# Patient Record
Sex: Male | Born: 1985
Health system: Southern US, Community
[De-identification: ages and names within clinical notes are randomized; demographics above are authoritative.]

## PROBLEM LIST (undated history)

## (undated) DIAGNOSIS — E119 Type 2 diabetes mellitus without complications: Secondary | ICD-10-CM

## (undated) HISTORY — PX: NO PAST SURGERIES: SHX2092

---

## 2005-05-28 ENCOUNTER — Emergency Department: Payer: Self-pay | Admitting: Emergency Medicine

## 2012-02-14 ENCOUNTER — Ambulatory Visit: Payer: Self-pay | Admitting: Emergency Medicine

## 2012-02-16 ENCOUNTER — Ambulatory Visit: Payer: Self-pay | Admitting: Family Medicine

## 2012-02-21 ENCOUNTER — Ambulatory Visit: Payer: Self-pay

## 2012-12-21 ENCOUNTER — Ambulatory Visit: Payer: Self-pay | Admitting: Physician Assistant

## 2012-12-22 ENCOUNTER — Ambulatory Visit: Payer: Self-pay | Admitting: Physician Assistant

## 2013-02-09 ENCOUNTER — Emergency Department: Payer: Self-pay | Admitting: Emergency Medicine

## 2013-02-09 LAB — URINALYSIS, COMPLETE
BILIRUBIN, UR: NEGATIVE
BLOOD: NEGATIVE
Glucose,UR: NEGATIVE mg/dL (ref 0–75)
Hyaline Cast: 9
Ketone: NEGATIVE
Nitrite: NEGATIVE
Ph: 5 (ref 4.5–8.0)
Protein: 30
RBC,UR: 8 /HPF (ref 0–5)
SPECIFIC GRAVITY: 1.032 (ref 1.003–1.030)
Squamous Epithelial: 4
WBC UR: 22 /HPF (ref 0–5)

## 2014-02-03 ENCOUNTER — Emergency Department: Payer: Self-pay | Admitting: Emergency Medicine

## 2014-02-03 LAB — CBC WITH DIFFERENTIAL/PLATELET
BASOS PCT: 0.4 %
Basophil #: 0 10*3/uL (ref 0.0–0.1)
EOS ABS: 0.2 10*3/uL (ref 0.0–0.7)
Eosinophil %: 1.4 %
HCT: 52.9 % — AB (ref 40.0–52.0)
HGB: 17.1 g/dL (ref 13.0–18.0)
Lymphocyte #: 1.1 10*3/uL (ref 1.0–3.6)
Lymphocyte %: 8.6 %
MCH: 29 pg (ref 26.0–34.0)
MCHC: 32.4 g/dL (ref 32.0–36.0)
MCV: 89 fL (ref 80–100)
Monocyte #: 0.7 x10 3/mm (ref 0.2–1.0)
Monocyte %: 5.7 %
NEUTROS ABS: 10.4 10*3/uL — AB (ref 1.4–6.5)
NEUTROS PCT: 83.9 %
Platelet: 230 10*3/uL (ref 150–440)
RBC: 5.92 10*6/uL — AB (ref 4.40–5.90)
RDW: 14.3 % (ref 11.5–14.5)
WBC: 12.4 10*3/uL — ABNORMAL HIGH (ref 3.8–10.6)

## 2014-02-03 LAB — COMPREHENSIVE METABOLIC PANEL
ALT: 54 U/L
Albumin: 3.7 g/dL (ref 3.4–5.0)
Alkaline Phosphatase: 73 U/L
Anion Gap: 6 — ABNORMAL LOW (ref 7–16)
BILIRUBIN TOTAL: 0.5 mg/dL (ref 0.2–1.0)
BUN: 21 mg/dL — AB (ref 7–18)
Calcium, Total: 9 mg/dL (ref 8.5–10.1)
Chloride: 102 mmol/L (ref 98–107)
Co2: 29 mmol/L (ref 21–32)
Creatinine: 1.18 mg/dL (ref 0.60–1.30)
EGFR (African American): >60
EGFR (Non-African Amer.): >60
Glucose: 174 mg/dL — ABNORMAL HIGH (ref 65–99)
Osmolality: 281 (ref 275–301)
POTASSIUM: 3.9 mmol/L (ref 3.5–5.1)
SGOT(AST): 30 U/L (ref 15–37)
Sodium: 137 mmol/L (ref 136–145)
TOTAL PROTEIN: 8.7 g/dL — AB (ref 6.4–8.2)

## 2014-02-03 LAB — LIPASE, BLOOD: Lipase: 102 U/L (ref 73–393)

## 2014-02-04 LAB — URINALYSIS, COMPLETE
BACTERIA: NONE SEEN
BLOOD: NEGATIVE
Bilirubin,UR: NEGATIVE
Glucose,UR: NEGATIVE mg/dL (ref 0–75)
Ketone: NEGATIVE
Leukocyte Esterase: NEGATIVE
Nitrite: NEGATIVE
PROTEIN: NEGATIVE
Ph: 5 (ref 4.5–8.0)
SPECIFIC GRAVITY: 1.029 (ref 1.003–1.030)
Squamous Epithelial: 1

## 2014-05-09 ENCOUNTER — Ambulatory Visit
Admit: 2014-05-09 | Disposition: A | Discharge status: Home / Self Care | Payer: Self-pay | Attending: Family Medicine | Admitting: Family Medicine

## 2014-12-09 ENCOUNTER — Emergency Department
Admission: EM | Admit: 2014-12-09 | Discharge: 2014-12-09 | Disposition: A | Discharge status: Home / Self Care | Payer: No Typology Code available for payment source | Attending: Emergency Medicine | Admitting: Emergency Medicine

## 2014-12-09 ENCOUNTER — Encounter: Payer: Self-pay | Admitting: Medical Oncology

## 2014-12-09 DIAGNOSIS — R0981 Nasal congestion: Secondary | ICD-10-CM | POA: Diagnosis present

## 2014-12-09 DIAGNOSIS — J0140 Acute pansinusitis, unspecified: Secondary | ICD-10-CM | POA: Diagnosis not present

## 2014-12-09 MED ORDER — AMOXICILLIN 500 MG PO TABS: 500.0000 mg | ORAL_TABLET | Freq: Three times a day (TID) | ORAL | Status: DC

## 2014-12-09 MED ORDER — GUAIFENESIN 200 MG PO TABS: 400.0000 mg | ORAL_TABLET | ORAL | Status: DC | PRN

## 2014-12-09 NOTE — Discharge Instructions (Signed)
Sinusitis en adultos (Sinusitis, Adult) La sinusitis es el enrojecimiento, el dolor y la inflamacin de los senos paranasales. Los senos paranasales son cavidades de aire que estn dentro de los huesos de la cara. Se encuentran debajo de los ojos, en la mitad de la frente y encima de los ojos. En los senos paranasales sanos, el moco puede drenar y el aire circula a travs de ellos en su camino hacia la nariz. Sin embargo, cuando se inflaman, el moco y el aire quedan atrapados. Esto hace que se desarrollen bacterias y otros grmenes que causan infeccin. La sinusitis puede desarrollarse rpidamente y durar solo un tiempo corto (aguda) o continuar por un perodo largo (crnica). La sinusitis que dura ms de 12 semanas se considera crnica. CAUSAS Las causas de la sinusitis son:  Alergias.  Las anomalas estructurales, como el desplazamiento del cartlago que separa las fosas nasales (desvo del tabique), que puede disminuir el flujo de aire por la nariz y los senos paranasales, y afectar su drenaje.  Las anomalas funcionales, como cuando los pequeos pelos (cilias) que se encuentran en los senos paranasales y ayudan a eliminar el moco no funcionan correctamente o no estn presentes. SIGNOS Y SNTOMAS Los sntomas de la sinusitis aguda y crnica son los mismos. Los sntomas principales son el dolor y la presin alrededor de los senos paranasales afectados. Otros sntomas son:  Dolor en los dientes superiores.  Dolor de odos.  Dolor de cabeza.  Mal aliento.  Disminucin del sentido del olfato y del gusto.  Tos, que empeora al acostarse.  Fatiga.  Fiebre.  Drenaje de moco espeso por la nariz, que generalmente es de color verde y puede contener pus (purulento).  Hinchazn y calor en los senos paranasales afectados. DIAGNSTICO Su mdico le realizar un examen fsico. Durante el examen, el mdico puede hacer cualquiera de estas cosas para determinar si usted tiene sinusitis aguda o  crnica:  Le revisar la nariz para buscar signos de crecimientos anormales en las fosas nasales (plipos nasales).  Palpar los senos paranasales afectados para buscar signos de infeccin.  Observar la parte interna de los senos paranasales con un dispositivo que tiene una luz (endoscopio). Si el mdico sospecha que usted sufre sinusitis crnica, podr indicar una o ms de las siguientes pruebas:  Pruebas de alergia.  Cultivo de las secreciones nasales. Se extrae una muestra de moco de la nariz, que se enva al laboratorio para detectar bacterias.  Citologa nasal. Se extrae una muestra de moco de la nariz, que el mdico examina para determinar si la sinusitis est relacionada con una alergia. TRATAMIENTO La mayora de los casos de sinusitis aguda se deben a una infeccin viral y se resuelven espontneamente en un perodo de 10das. A veces, se recetan medicamentos como ayuda para aliviar los sntomas tanto de la sinusitis aguda como de la crnica. Estos pueden incluir analgsicos, descongestivos, aerosoles nasales con corticoides o aerosoles de solucin salina. Sin embargo, para la sinusitis por infeccin bacteriana, el mdico le recetar antibiticos. Los antibiticos son medicamentos que destruyen las bacterias que causan la infeccin. Con poca frecuencia, la sinusitis tiene su origen en una infeccin por hongos. En estos casos, el mdico le recetar un medicamento antimictico. Para algunos casos de sinusitis crnica, es necesario someterse a una ciruga. Generalmente, se trata de casos en los que la sinusitis se repite ms de 3veces al ao, a pesar de otros tratamientos. INSTRUCCIONES PARA EL CUIDADO EN EL HOGAR  Beber abundante agua. Los lquidos ayudan a disolver   el moco, para que drene ms fcilmente de los senos paranasales.  Use un humidificador.  Inhale vapor de 3a 4veces al da (por ejemplo, sintese en el bao con la ducha abierta).  Aplquese un pao tibio y hmedo en  la cara 3 o 4veces al da o segn las indicaciones del mdico.  Use un aerosol nasal salino para ayudar a humedecer y limpiar los senos nasales.  Tome los medicamentos solamente como se lo haya indicado el mdico.  Si le recetaron un antimictico o un antibitico, asegrese de terminarlos, incluso si comienza a sentirse mejor. SOLICITE ATENCIN MDICA DE INMEDIATO SI:  Siente ms dolor o sufre dolores de cabeza intensos.  Tiene nuseas, vmitos o somnolencia.  Observa hinchazn alrededor del rostro.  Tiene problemas de visin.  Presenta rigidez en el cuello.  Tiene dificultad para respirar.   Esta informacin no tiene como fin reemplazar el consejo del mdico. Asegrese de hacerle al mdico cualquier pregunta que tenga.   Document Released: 10/07/2004 Document Revised: 01/18/2014 Elsevier Interactive Patient Education 2016 [employer]   

## 2014-12-09 NOTE — ED Notes (Signed)
Pt reports nasal congestion, body aches and fever that began  11/24.

## 2014-12-09 NOTE — ED Provider Notes (Signed)
[facility] Emergency Department Provider Note ____________________________________________  Time seen: Approximately 7:51 PM  I have reviewed the triage vital signs and the nursing notes.   HISTORY  Chief Complaint Nasal Congestion; Generalized Body Aches; and URI   HPI Kohner Orlick is a 29 y.o. male who presents to the emergency department for evaluation of nasal congestion, sore throat, body aches, and cough. Symptoms have been present since 12/05/2014. He has taken Claritin, Vicks, and TheraFlu without relief.   History reviewed. No pertinent past medical history.  There are no active problems to display for this patient.   History reviewed. No pertinent past surgical history.  Current Outpatient Rx  Name  Route  Sig  Dispense  Refill  . amoxicillin (AMOXIL) 500 MG tablet   Oral   Take 1 tablet (500 mg total) by mouth 3 (three) times daily.   30 tablet   0   . guaiFENesin 200 MG tablet   Oral   Take 2 tablets (400 mg total) by mouth every 4 (four) hours as needed for cough or to loosen phlegm.   30 suppository   0     Allergies Review of patient's allergies indicates no known allergies.  No family history on file.  Social History Social History  Substance Use Topics  . Smoking status: Never Smoker   . Smokeless tobacco: None  . Alcohol Use: No    Review of Systems Constitutional: No fever/chills Eyes: No visual changes. ENT: No sore throat. Cardiovascular: Denies chest pain. Respiratory: Negative for shortness of breath. Positive for cough. Gastrointestinal: Negative for abdominal pain. Negative for nausea,  negative for vomiting.  Negative for diarrhea.  Genitourinary: Negative for dysuria. Musculoskeletal: Positive for body aches Skin: Negative for rash. Neurological: Negative for headaches, Negative for focal weakness or numbness.  10-point ROS otherwise  negative.  ____________________________________________   PHYSICAL EXAM:  VITAL SIGNS: ED Triage Vitals  Enc Vitals Group     BP 12/09/14 1817 143/85 mmHg     Pulse Rate 12/09/14 1817 90     Resp 12/09/14 1817 20     Temp 12/09/14 1817 98.2 F (36.8 C)     Temp Source 12/09/14 1817 Oral     SpO2 12/09/14 1817 96 %     Weight 12/09/14 1817 300 lb (136.079 kg)     Height 12/09/14 1817  (1.676 m)     Head Cir --      Peak Flow --      Pain Score 12/09/14 1818 5     Pain Loc --      Pain Edu? --      Excl. in GC? --     Constitutional: Alert and oriented. Well appearing and in no acute distress. Eyes: Conjunctivae are normal. PERRL. EOMI. Head: Atraumatic. Nose: No congestion/rhinnorhea. Mouth/Throat: Mucous membranes are moist.  Oropharynx non-erythematous. Neck: No stridor.  Lymphatic: No cervical lymphadenopathy. Cardiovascular: Normal rate, regular rhythm. Grossly normal heart sounds.  Good peripheral circulation. Respiratory: Normal respiratory effort.  No retractions. Lungs clear to auscultation bilaterally. Gastrointestinal: Soft and nontender. No distention. No abdominal bruits. No CVA tenderness. Musculoskeletal: No joint pain reported. Neurologic:  Normal speech and language. No gross focal neurologic deficits are appreciated. Speech is normal. No gait instability. Skin:  Skin is warm, dry and intact. No rash noted. Psychiatric: Mood and affect are normal. Speech and behavior are normal.  ____________________________________________   LABS (all labs ordered are listed, but only abnormal results are displayed)  Labs Reviewed - No data to display ____________________________________________  EKG   ____________________________________________  RADIOLOGY  Not indicated ____________________________________________   PROCEDURES  Procedure(s) performed: None  Critical Care performed: No  ____________________________________________   INITIAL  IMPRESSION / ASSESSMENT AND PLAN / ED COURSE  Pertinent labs & imaging results that were available during my care of the patient were reviewed by me and considered in my medical decision making (see chart for details).   Patient was advised to follow-up with the primary care provider of his choice for symptoms that are not improving over the next 5 days. He was advised to return to the emergency department for symptoms that change or worsen if unable schedule an appointment. ____________________________________________   FINAL CLINICAL IMPRESSION(S) / ED DIAGNOSES  Final diagnoses:  Acute pansinusitis, recurrence not specified       Chinita PesterCari B Atlee Kluth, FNP 12/10/14 0003  Loleta Roseory Forbach, MD 12/10/14 [id number]

## 2015-10-20 ENCOUNTER — Ambulatory Visit
Admission: EM | Admit: 2015-10-20 | Discharge: 2015-10-20 | Disposition: A | Discharge status: Home / Self Care | Payer: Worker's Compensation | Attending: Family Medicine | Admitting: Family Medicine

## 2015-10-20 DIAGNOSIS — S29012A Strain of muscle and tendon of back wall of thorax, initial encounter: Secondary | ICD-10-CM

## 2015-10-20 DIAGNOSIS — W19XXXA Unspecified fall, initial encounter: Secondary | ICD-10-CM | POA: Diagnosis not present

## 2015-10-20 DIAGNOSIS — S39012A Strain of muscle, fascia and tendon of lower back, initial encounter: Secondary | ICD-10-CM | POA: Diagnosis not present

## 2015-10-20 MED ORDER — CYCLOBENZAPRINE HCL 10 MG PO TABS: 10.0000 mg | ORAL_TABLET | Freq: Every day | ORAL | 0 refills | Status: DC

## 2015-10-20 NOTE — ED Provider Notes (Signed)
[facility]    CSN: [id number] Arrival date & time: 10/20/15  1011     History   Chief Complaint Chief Complaint  Patient presents with  . Fall    HPI Bradley Hood is a 30 y.o. male.    30 yo male c/o low back pain and left shoulder pain after falling 3 days ago (Oct. 7) at work. States he was falling down some stairs but was able to stretch arms to decrease impact. States he did not directly hit his back but felt a pop as she stretched out his arm and twist his body. 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    The history is provided by the patient.    History reviewed. No pertinent past medical history.  There are no active problems to display for this patient.   Past Surgical History:  Procedure Laterality Date  . NO PAST SURGERIES         Home Medications    Prior to Admission medications   Medication Sig Start Date End Date Taking? Authorizing Provider  amoxicillin (AMOXIL) 500 MG tablet Take 1 tablet (500 mg total) by mouth 3 (three) times daily. 12/09/14   Chinita Pesterari B Triplett, FNP  cyclobenzaprine (FLEXERIL) 10 MG tablet Take 1 tablet (10 mg total) by mouth at bedtime. 10/20/15   Payton Mccallumrlando Arieliz Latino, MD  guaiFENesin 200 MG tablet Take 2 tablets (400 mg total) by mouth every 4 (four) hours as needed for cough or to loosen phlegm. 12/09/14   Chinita Pesterari B Triplett, FNP    Family History History reviewed. No pertinent family history.  Social History Social History  Substance Use Topics  . Smoking status: Never Smoker  . Smokeless tobacco: Never Used  . Alcohol use Yes     Comment: rarely     Allergies   Review of patient's allergies indicates no known allergies.   Review of Systems Review of Systems   Physical Exam Triage Vital Signs ED Triage Vitals  Enc Vitals Group     BP 10/20/15 1112 126/73     Pulse Rate 10/20/15 1112 92     Resp 10/20/15 1112 16     Temp 10/20/15 1112 98 F (36.7 C)     Temp Source  10/20/15 1112 Oral     SpO2 10/20/15 1112 97 %     Weight 10/20/15 1110 300 lb (136.1 kg)     Height 10/20/15 1110 5\' 7"  (1.702 m)     Head Circumference --      Peak Flow --      Pain Score 10/20/15 1111 4     Pain Loc --      Pain Edu? --      Excl. in GC? --    No data found.   Updated Vital Signs BP 126/73 (BP Location: Left Arm)   Pulse 92   Temp 98 F (36.7 C) (Oral)   Resp 16   Ht 5\' 7"  (1.702 m)   Wt 300 lb (136.1 kg)   SpO2 97%   BMI 46.99 kg/m   Visual Acuity Right Eye Distance:   Left Eye Distance:   Bilateral Distance:    Right Eye Near:   Left Eye Near:    Bilateral Near:     Physical Exam  Constitutional: He appears well-developed and well-nourished. No distress.  HENT:  Head: Normocephalic and atraumatic.  Eyes: EOM are normal. Pupils are equal, round, and reactive to  light.  Neck: Normal range of motion. Neck supple. No tracheal deviation present.  Pulmonary/Chest: Effort normal. No stridor. No respiratory distress.  Musculoskeletal:       Left shoulder: He exhibits tenderness (over the deltoid muscle). He exhibits normal range of motion, no bony tenderness, no swelling, no effusion, no crepitus, no deformity, no laceration, no pain, no spasm, normal pulse and normal strength.       Cervical back: He exhibits tenderness (over the trapezius muscle on the left) and spasm. He exhibits normal range of motion, no bony tenderness, no swelling, no edema, no deformity, no laceration, no pain and normal pulse.       Lumbar back: He exhibits tenderness (ove the lumbar paraspinous muscles bilaterally) and spasm. He exhibits normal range of motion, no bony tenderness, no swelling, no edema, no deformity, no laceration, no pain and normal pulse.  Neurological: He is alert. He has normal reflexes. He displays normal reflexes. He exhibits normal muscle tone. Coordination normal.  Skin: No rash noted. He is not diaphoretic.  Nursing note and vitals reviewed.    UC  Treatments / Results  Labs (all labs ordered are listed, but only abnormal results are displayed) Labs Reviewed - No data to display  EKG  EKG Interpretation None       Radiology No results found.  Procedures Procedures (including critical care time)  Medications Ordered in UC Medications - No data to display   Initial Impression / Assessment and Plan / UC Course  I have reviewed the triage vital signs and the nursing notes.  Pertinent labs & imaging results that were available during my care of the patient were reviewed by me and considered in my medical decision making (see chart for details).  Clinical Course      Final Clinical Impressions(s) / UC Diagnoses   Final diagnoses:  Fall, initial encounter  Upper back strain, initial encounter  Strain of lumbar region, initial encounter    New Prescriptions Discharge Medication List as of 10/20/2015 12:34 PM    START taking these medications   Details  cyclobenzaprine (FLEXERIL) 10 MG tablet Take 1 tablet (10 mg total) by mouth at bedtime., Starting Mon 10/20/2015, Normal       1. diagnosis reviewed with patient 2. rx as per orders above; reviewed possible side effects, interactions, risks and benefits  3. Recommend supportive treatment with heat/ice, stretches; work restrictions for no heavy lifting, pulling, pushing for one week 4. Follow-up in 1 week at [facility]   Payton Mccallum, MD 10/22/15 1235

## 2015-10-20 NOTE — ED Notes (Signed)
Urine Drug screen completed, [facility] lab.

## 2015-10-20 NOTE — ED Triage Notes (Signed)
Patient complains of fall on Saturday afternoon at work. Patient states that he fell down some stairs and felt a pop in his back, and now has some slight back pain and left shoulder pain. Patient states that he is unsure what caused him to fall.

## 2015-10-22 ENCOUNTER — Telehealth: Payer: Self-pay

## 2015-10-22 NOTE — Telephone Encounter (Signed)
Courtesy call back completed today [facility]. Patient did not answer, no voicemail.

## 2017-05-24 ENCOUNTER — Other Ambulatory Visit: Payer: Self-pay

## 2017-05-24 ENCOUNTER — Emergency Department
Admission: EM | Admit: 2017-05-24 | Discharge: 2017-05-24 | Disposition: A | Discharge status: Home / Self Care | Payer: Commercial Managed Care - HMO | Attending: Emergency Medicine | Admitting: Emergency Medicine

## 2017-05-24 ENCOUNTER — Emergency Department: Payer: Commercial Managed Care - HMO

## 2017-05-24 DIAGNOSIS — R1084 Generalized abdominal pain: Secondary | ICD-10-CM | POA: Diagnosis not present

## 2017-05-24 DIAGNOSIS — J029 Acute pharyngitis, unspecified: Secondary | ICD-10-CM | POA: Diagnosis present

## 2017-05-24 DIAGNOSIS — J020 Streptococcal pharyngitis: Secondary | ICD-10-CM | POA: Insufficient documentation

## 2017-05-24 LAB — CBC WITH DIFFERENTIAL/PLATELET
Basophils Absolute: 0.1 10*3/uL (ref 0–0.1)
Basophils Relative: 1 %
Eosinophils Absolute: 0.2 10*3/uL (ref 0–0.7)
Eosinophils Relative: 2 %
HCT: 49.7 % (ref 40.0–52.0)
Hemoglobin: 16.8 g/dL (ref 13.0–18.0)
Lymphocytes Relative: 10 %
Lymphs Abs: 1.3 10*3/uL (ref 1.0–3.6)
MCH: 30.5 pg (ref 26.0–34.0)
MCHC: 33.8 g/dL (ref 32.0–36.0)
MCV: 90.1 fL (ref 80.0–100.0)
Monocytes Absolute: 0.9 10*3/uL (ref 0.2–1.0)
Monocytes Relative: 7 %
Neutro Abs: 10.7 10*3/uL — ABNORMAL HIGH (ref 1.4–6.5)
Neutrophils Relative %: 82 %
Platelets: 171 10*3/uL (ref 150–440)
RBC: 5.52 MIL/uL (ref 4.40–5.90)
RDW: 15 % — ABNORMAL HIGH (ref 11.5–14.5)
WBC: 13.1 10*3/uL — ABNORMAL HIGH (ref 3.8–10.6)

## 2017-05-24 LAB — COMPREHENSIVE METABOLIC PANEL
ALT: 36 U/L (ref 17–63)
AST: 24 U/L (ref 15–41)
Albumin: 3.8 g/dL (ref 3.5–5.0)
Alkaline Phosphatase: 67 U/L (ref 38–126)
Anion gap: 6 (ref 5–15)
BUN: 17 mg/dL (ref 6–20)
CO2: 31 mmol/L (ref 22–32)
Calcium: 8.8 mg/dL — ABNORMAL LOW (ref 8.9–10.3)
Chloride: 98 mmol/L — ABNORMAL LOW (ref 101–111)
Creatinine, Ser: 0.99 mg/dL (ref 0.61–1.24)
GFR calc Af Amer: >60 mL/min (ref >60)
GFR calc non Af Amer: >60 mL/min (ref >60)
Glucose, Bld: 147 mg/dL — ABNORMAL HIGH (ref 65–99)
Potassium: 3.8 mmol/L (ref 3.5–5.1)
Sodium: 135 mmol/L (ref 135–145)
Total Bilirubin: 0.9 mg/dL (ref 0.3–1.2)
Total Protein: 8.2 g/dL — ABNORMAL HIGH (ref 6.5–8.1)

## 2017-05-24 LAB — GROUP A STREP BY PCR: Group A Strep by PCR: DETECTED — AB

## 2017-05-24 MED ORDER — SODIUM CHLORIDE 0.9 % IV BOLUS
1000.0000 mL | Freq: Once | INTRAVENOUS | Status: AC
  Administered 2017-05-24: 1000 mL via INTRAVENOUS

## 2017-05-24 MED ORDER — DEXAMETHASONE SODIUM PHOSPHATE 10 MG/ML IJ SOLN
10.0000 mg | Freq: Once | INTRAMUSCULAR | Status: AC
Start: 2017-05-24 — End: 2017-05-24
  Administered 2017-05-24: 10 mg via INTRAVENOUS
  Filled 2017-05-24: qty 1

## 2017-05-24 MED ORDER — AMOXICILLIN 875 MG PO TABS: 875.0000 mg | ORAL_TABLET | Freq: Two times a day (BID) | ORAL | 0 refills | Status: AC

## 2017-05-24 MED ORDER — IOPAMIDOL (ISOVUE-370) INJECTION 76%
75.0000 mL | Freq: Once | INTRAVENOUS | Status: AC | PRN
  Administered 2017-05-24: 75 mL via INTRAVENOUS
  Filled 2017-05-24: qty 75

## 2017-05-24 MED ORDER — SODIUM CHLORIDE 0.9 % IV SOLN
3.0000 g | Freq: Once | INTRAVENOUS | Status: AC
  Administered 2017-05-24: 3 g via INTRAVENOUS
  Filled 2017-05-24: qty 3

## 2017-05-24 NOTE — ED Notes (Signed)
Pt verbalized discharge instructions and has no questions at this time 

## 2017-05-24 NOTE — ED Triage Notes (Signed)
Sore throat and nausea since last night. Pt alert and oriented X4, active, cooperative, pt in NAD. RR even and unlabored, color WNL.

## 2017-05-24 NOTE — ED Notes (Signed)
Patient transported to CT 

## 2017-05-24 NOTE — ED Provider Notes (Signed)
[facility] Department Provider Note  ____________________________________________  Time seen: Approximately 7:25 PM  I have reviewed the triage vital signs and the nursing notes.   HISTORY  Chief Complaint Sore Throat    HPI [place] Hood is a 32 y.o. male presents to the emergency department with pharyngitis that has occurred since last night. Patient has headache, fever, chills, malaise and abdominal discomfort.  He is having difficulty swallowing.  Patient is speaking in complete sentences in the emergency department.  He denies history of peritonsillar abscesses or retropharyngeal abscesses.  No alleviating measures have been attempted.  History reviewed. No pertinent past medical history.  There are no active problems to display for this patient.   Past Surgical History:  Procedure Laterality Date  . NO PAST SURGERIES      Prior to Admission medications   Medication Sig Start Date End Date Taking? Authorizing Provider  amoxicillin (AMOXIL) 875 MG tablet Take 1 tablet (875 mg total) by mouth 2 (two) times daily for 10 days. 05/24/17 06/03/17  Orvil Feil, PA-C  cyclobenzaprine (FLEXERIL) 10 MG tablet Take 1 tablet (10 mg total) by mouth at bedtime. 10/20/15   Payton Mccallum, MD  guaiFENesin 200 MG tablet Take 2 tablets (400 mg total) by mouth every 4 (four) hours as needed for cough or to loosen phlegm. 12/09/14   Chinita Pester, FNP    Allergies Patient has no known allergies.  No family history on file.  Social History Social History   Tobacco Use  . Smoking status: Never Smoker  . Smokeless tobacco: Never Used  Substance Use Topics  . Alcohol use: Yes    Comment: rarely  . Drug use: No     Review of Systems  Constitutional: Patient has fever.  Eyes: No visual changes. No discharge ENT: Patient has pharyngitis.  Cardiovascular: no chest pain. Respiratory: no cough. No SOB. Gastrointestinal: Patient has abdominal  discomfort.  Genitourinary: Negative for dysuria. No hematuria Musculoskeletal: Negative for musculoskeletal pain. Skin: Negative for rash, abrasions, lacerations, ecchymosis. Neurological: Patient has headache, no focal weakness or numbness.   ____________________________________________   PHYSICAL EXAM:  VITAL SIGNS: ED Triage Vitals  Enc Vitals Group     BP 05/24/17 1758 139/86     Pulse Rate 05/24/17 1758 (!) 103     Resp 05/24/17 1758 18     Temp 05/24/17 1758 100.2 F (37.9 C)     Temp Source 05/24/17 1758 Oral     SpO2 05/24/17 1758 90 %     Weight 05/24/17 1758 (!) 320 lb (145.2 kg)     Height 05/24/17 1758  (1.702 m)     Head Circumference --      Peak Flow --      Pain Score 05/24/17 1803 5     Pain Loc --      Pain Edu? --      Excl. in GC? --      Constitutional: Alert and oriented. Well appearing and in no acute distress. Eyes: Conjunctivae are normal. PERRL. EOMI. Head: Atraumatic. ENT:      Ears: TMs are pearly.      Nose: No congestion/rhinnorhea.      Mouth/Throat: Mucous membranes are moist.  Right tonsil appears significantly larger than left.  Right tonsil, uvula and left tonsil are in contact with each other. Neck: No stridor.  No cervical spine tenderness to palpation. Cardiovascular: Normal rate, regular rhythm. Normal S1 and S2.  Good peripheral circulation.  Respiratory: Normal respiratory effort without tachypnea or retractions. Lungs CTAB. Good air entry to the bases with no decreased or absent breath sounds. Gastrointestinal: Bowel sounds 4 quadrants. Soft and nontender to palpation. No guarding or rigidity. No palpable masses. No distention. No CVA tenderness.  Skin:  Skin is warm, dry and intact. No rash noted.   ____________________________________________   LABS (all labs ordered are listed, but only abnormal results are displayed)  Labs Reviewed  GROUP A STREP BY PCR - Abnormal; Notable for the following components:       Result Value   Group A Strep by PCR DETECTED (*)    All other components within normal limits  CBC WITH DIFFERENTIAL/PLATELET - Abnormal; Notable for the following components:   WBC 13.1 (*)    RDW 15.0 (*)    Neutro Abs 10.7 (*)    All other components within normal limits  COMPREHENSIVE METABOLIC PANEL - Abnormal; Notable for the following components:   Chloride 98 (*)    Glucose, Bld 147 (*)    Calcium 8.8 (*)    Total Protein 8.2 (*)    All other components within normal limits   ____________________________________________  EKG   ____________________________________________  RADIOLOGY Geraldo Pitter, personally viewed and evaluated these images as part of my medical decision making, as well as reviewing the written report by the radiologist.    Ct Soft Tissue Neck W Contrast  Result Date: 05/24/2017 CLINICAL DATA:  Initial evaluation for acute sore throat and fever. EXAM: CT NECK WITH CONTRAST TECHNIQUE: Multidetector CT imaging of the neck was performed using the standard protocol following the bolus administration of intravenous contrast. CONTRAST:  75mL ISOVUE-370 IOPAMIDOL (ISOVUE-370) INJECTION 76% COMPARISON:  None. FINDINGS: Pharynx and larynx: Oral cavity within normal limits without mass lesion or loculated fluid collection. Scattered dental caries without acute inflammation. Palatine tonsils are enlarged and hyperenhancing bilaterally, suggesting acute tonsillitis. No discrete tonsillar or peritonsillar abscess. Small calcified sialoliths noted on the left. Adenoidal soft tissues prominent as well. Associated circumferential mucosal edema within the adjacent oropharynx extending inferiorly along the hypopharynx, consistent with associated pharyngitis. Parapharyngeal fat maintained. Mild retropharyngeal effusion without frank retropharyngeal collection or abscess. Epiglottis itself is normal and without acute inflammatory changes. Vallecula clear. True cords symmetric  and within normal limits. Subglottic airway clear. Salivary glands: Diffuse fatty infiltration noted within the parotid glands bilaterally. Parotid and submandibular glands otherwise within normal limits. Thyroid: Thyroid grossly unremarkable, although evaluation somewhat limited by body habitus. Lymph nodes: Several prominent retropharyngeal lymph nodes noted, most notable of which is seen on the right and measures 14 mm in short axis (series 2, image 35). Subtle hypodensity within this node suggestive of central necrosis. Bilateral level II nodes measure up to 12 mm on the right and 13 mm on the left. 13 mm right level 3 node noted as well. Findings are most likely reactive in nature. Vascular: Normal intravascular enhancement seen throughout the neck. Limited intracranial: Unremarkable. Visualized orbits: Not included on this exam. Mastoids and visualized paranasal sinuses: Left maxillary sinus retention cyst. Visualized paranasal sinuses otherwise clear. Mastoid air cells and middle ear cavities are clear. Skeleton: No acute osseous abnormality. No worrisome lytic or blastic osseous lesions. Upper chest: Visualized upper chest and mediastinum demonstrate no acute abnormality. Partially visualized lungs are clear. Other: None. IMPRESSION: 1. Findings suggestive of acute tonsillitis/pharyngitis as above. No discrete abscess or drainable collection identified. 2. Enlarged bilateral retropharyngeal and cervical adenopathy, likely reactive. 3. Scattered dental caries  without acute inflammation. Electronically Signed   By: Rise Mu M.D.   On: 05/24/2017 21:05    ____________________________________________    PROCEDURES  Procedure(s) performed:    Procedures    Medications  dexamethasone (DECADRON) injection 10 mg (10 mg Intravenous Given 05/24/17 1957)  Ampicillin-Sulbactam (UNASYN) 3 g in sodium chloride 0.9 % 100 mL IVPB (0 g Intravenous Stopped 05/24/17 2034)  sodium chloride 0.9 %  bolus 1,000 mL (0 mLs Intravenous Stopped 05/24/17 2149)  iopamidol (ISOVUE-370) 76 % injection 75 mL (75 mLs Intravenous Contrast Given 05/24/17 2035)     ____________________________________________   INITIAL IMPRESSION / ASSESSMENT AND PLAN / ED COURSE  Pertinent labs & imaging results that were available during my care of the patient were reviewed by me and considered in my medical decision making (see chart for details).  Review of the [place] CSRS was performed in accordance of the [facility] prior to dispensing any controlled drugs.     Assessment and Plan:  Strep pharyngitis Patient presents to the emergency department with fever, headache, abdominal discomfort and fever. Due to larger appearance of right tonsil, CT soft tissue neck was obtained.  No peritonsillar or retropharyngeal abscesses were identified on CT.  Patient was treated empirically with Unasyn in the emergency department.  He was discharged with amoxicillin and advised to follow-up with primary care as needed.  All patient questions were answered.   ____________________________________________  FINAL CLINICAL IMPRESSION(S) / ED DIAGNOSES  Final diagnoses:  Strep pharyngitis      NEW MEDICATIONS STARTED DURING THIS VISIT:  ED Discharge Orders        Ordered    amoxicillin (AMOXIL) 875 MG tablet  2 times daily     05/24/17 2117          This chart was dictated using voice recognition software/Dragon. Despite best efforts to proofread, errors can occur which can change the meaning. Any change was purely unintentional.    Orvil Feil, PA-C 05/24/17 2231    Sharman Cheek, MD 05/25/17 [phone]

## 2017-05-24 NOTE — ED Notes (Signed)
See triage note  Presents with sore throat and fever since last pm  Low grade fever on arrival  This afternoon having increased pain with swallowing

## 2017-11-09 ENCOUNTER — Emergency Department
Admission: EM | Admit: 2017-11-09 | Discharge: 2017-11-09 | Disposition: A | Discharge status: Home / Self Care | Payer: 59 | Attending: Student in an Organized Health Care Education/Training Program | Admitting: Student in an Organized Health Care Education/Training Program

## 2017-11-09 ENCOUNTER — Encounter: Payer: Self-pay | Admitting: Emergency Medicine

## 2017-11-09 ENCOUNTER — Other Ambulatory Visit: Payer: Self-pay

## 2017-11-09 DIAGNOSIS — J0100 Acute maxillary sinusitis, unspecified: Secondary | ICD-10-CM

## 2017-11-09 DIAGNOSIS — R05 Cough: Secondary | ICD-10-CM | POA: Diagnosis present

## 2017-11-09 MED ORDER — AMOXICILLIN 875 MG PO TABS: 875.0000 mg | ORAL_TABLET | Freq: Two times a day (BID) | ORAL | 0 refills | Status: DC

## 2017-11-09 NOTE — ED Triage Notes (Signed)
Patient complaining of "cold symptoms" starting yesterday PM.  Here this AM complaining of cough, non-productive and nausea staring last PM.  Denies vomiting.  Alert and oriented.  NAD.

## 2017-11-09 NOTE — ED Provider Notes (Signed)
[facility] Emergency Department Provider Note  ____________________________________________   First MD Initiated Contact with Patient 11/09/17 1019     (approximate)  I have reviewed the triage vital signs and the nursing notes.   HISTORY  Chief Complaint Cough and Nausea    HPI [place] Hood is a 32 y.o. malepresents emergency department complaining of cough and congestion with yellow to green mucus.  Also complains of sinus pain and drainage.  Moderate sore throat.  States he had some nausea yesterday.  Said some fever and chills today.  No chest pain or shortness of breath.  Symptoms for 2 days.    History reviewed. No pertinent past medical history.  There are no active problems to display for this patient.   Past Surgical History:  Procedure Laterality Date  . NO PAST SURGERIES      Prior to Admission medications   Medication Sig Start Date End Date Taking? Authorizing Provider  amoxicillin (AMOXIL) 875 MG tablet Take 1 tablet (875 mg total) by mouth 2 (two) times daily. 11/09/17   Faythe Ghee, PA-C    Allergies Patient has no known allergies.  History reviewed. No pertinent family history.  Social History Social History   Tobacco Use  . Smoking status: Never Smoker  . Smokeless tobacco: Never Used  Substance Use Topics  . Alcohol use: Yes    Comment: rarely  . Drug use: No    Review of Systems  Constitutional: No fever/chills Eyes: No visual changes. ENT: No sore throat. Respiratory: Positive cough and congestion, positive wheezing Genitourinary: Negative for dysuria. Musculoskeletal: Negative for back pain. Skin: Negative for rash.    ____________________________________________   PHYSICAL EXAM:  VITAL SIGNS: ED Triage Vitals  Enc Vitals Group     BP 11/09/17 0955 109/69     Pulse Rate 11/09/17 0955 94     Resp 11/09/17 0955 16     Temp 11/09/17 0955 98.7 F (37.1 C)     Temp Source 11/09/17 0955 Oral    SpO2 11/09/17 0955 92 %     Weight 11/09/17 0956 300 lb (136.1 kg)     Height 11/09/17 0956 5\' 7"  (1.702 m)     Head Circumference --      Peak Flow --      Pain Score 11/09/17 0956 0     Pain Loc --      Pain Edu? --      Excl. in GC? --     Constitutional: Alert and oriented. Well appearing and in no acute distress. Eyes: Conjunctivae are normal.  Head: Atraumatic. ENT: TMS clear bilaterally Nose: No congestion/rhinnorhea.  Nasal mucosa is bright red and swollen Mouth/Throat: Mucous membranes are moist.  Throat positive for swollen tonsils and post nasal drip NECK: Is supple, no lymphadenopathy is noted  cardiovascular: Normal rate, regular rhythm.  Heart sounds are normal Respiratory: Normal respiratory effort.  No retractions, lungs clear to auscultation GU: deferred Musculoskeletal: FROM all extremities, warm and well perfused Neurologic:  Normal speech and language.  Skin:  Skin is warm, dry and intact. No rash noted. Psychiatric: Mood and affect are normal. Speech and behavior are normal.  ____________________________________________   LABS (all labs ordered are listed, but only abnormal results are displayed)  Labs Reviewed - No data to display ____________________________________________   ____________________________________________  RADIOLOGY    ____________________________________________   PROCEDURES  Procedure(s) performed: No  Procedures    ____________________________________________   INITIAL IMPRESSION / ASSESSMENT AND PLAN /  ED COURSE  Pertinent labs & imaging results that were available during my care of the patient were reviewed by me and considered in my medical decision making (see chart for details).   Patient is 32 year old male presents emergency department complaining of sinus infection along with some nausea.  Physical exam patient appears well.  Sinuses are mildly tender in the nasal mucosa is bright red swollen.  Remainder  the exam is unremarkable  Patient was diagnosed with acute sinusitis.  Was given prescription for amoxicillin 875.  He is to continue taking his Sudafed and Nasonex.  Follow-up with his regular doctor if not better in 3 to 5 days.  He was given a work note as requested for [employer].  He was discharged in stable condition.     As part of my medical decision making, I reviewed the following data within the electronic MEDICAL RECORD NUMBER Nursing notes reviewed and incorporated, Notes from prior ED visits and [place] Controlled Substance Database  ____________________________________________   FINAL CLINICAL IMPRESSION(S) / ED DIAGNOSES  Final diagnoses:  Acute maxillary sinusitis, recurrence not specified      NEW MEDICATIONS STARTED DURING THIS VISIT:  New Prescriptions   AMOXICILLIN (AMOXIL) 875 MG TABLET    Take 1 tablet (875 mg total) by mouth 2 (two) times daily.     Note:  This document was prepared using Dragon voice recognition software and may include unintentional dictation errors.     Faythe Ghee, PA-C 11/09/17 1042    Willy Eddy, MD 11/09/17 1248

## 2018-08-27 ENCOUNTER — Emergency Department
Admission: EM | Admit: 2018-08-27 | Discharge: 2018-08-27 | Disposition: A | Discharge status: Home / Self Care | Payer: 59 | Attending: Emergency Medicine | Admitting: Emergency Medicine

## 2018-08-27 ENCOUNTER — Other Ambulatory Visit: Payer: Self-pay

## 2018-08-27 DIAGNOSIS — N4889 Other specified disorders of penis: Secondary | ICD-10-CM | POA: Diagnosis present

## 2018-08-27 DIAGNOSIS — N481 Balanitis: Secondary | ICD-10-CM

## 2018-08-27 LAB — URINALYSIS, COMPLETE (UACMP) WITH MICROSCOPIC
Bacteria, UA: NONE SEEN
Bilirubin Urine: NEGATIVE
Glucose, UA: >=500 mg/dL — AB
Ketones, ur: NEGATIVE mg/dL
Nitrite: NEGATIVE
Protein, ur: NEGATIVE mg/dL
Specific Gravity, Urine: 1.028 (ref 1.005–1.030)
pH: 6 (ref 5.0–8.0)

## 2018-08-27 MED ORDER — MUPIROCIN CALCIUM 2 % EX CREA: TOPICAL_CREAM | CUTANEOUS | 0 refills | Status: DC

## 2018-08-27 MED ORDER — SULFAMETHOXAZOLE-TRIMETHOPRIM 800-160 MG PO TABS: 1.0000 | ORAL_TABLET | Freq: Two times a day (BID) | ORAL | 0 refills | Status: DC

## 2018-08-27 MED ORDER — NYSTATIN 100000 UNIT/GM EX CREA: 1.0000 "application " | TOPICAL_CREAM | Freq: Two times a day (BID) | CUTANEOUS | 0 refills | Status: DC

## 2018-08-27 NOTE — ED Triage Notes (Signed)
Pt states the tip of his penis is red, itchy and swollen. Pt states he also has burning with urination. Pt denies discharge or fever.

## 2018-08-27 NOTE — ED Provider Notes (Signed)
[facility] Emergency Department Provider Note  ____________________________________________  Time seen: Approximately 9:35 PM  I have reviewed the triage vital signs and the nursing notes.   HISTORY  Chief Complaint Groin Swelling    HPI [place] Hood is a 33 y.o. male who presents the emergency department complaining of pain, swelling to the glans of the penis.  Patient reports that he has had symptoms x2 days.  He reports that the glans of the penis is erythematous, painful.  Patient reports that he is having no dysuria, hematuria, polyuria.  No testicular pain.  Patient denies any concerns for STD.  He reports that he is seeing no skin lesions to include chancres or painful lesions.  Patient applied hydrocortisone to the area yesterday for symptom relief.  No history of same in the past.         No past medical history on file.  There are no active problems to display for this patient.   Past Surgical History:  Procedure Laterality Date  . NO PAST SURGERIES      Prior to Admission medications   Medication Sig Start Date End Date Taking? Authorizing Provider  amoxicillin (AMOXIL) 875 MG tablet Take 1 tablet (875 mg total) by mouth 2 (two) times daily. 11/09/17   Sherrie MustacheFisher, Roselyn BeringSusan W, PA-C  mupirocin cream (BACTROBAN) 2 % Apply to affected area 3 times daily 08/27/18   Cuthriell, Delorise RoyalsJonathan D, PA-C  nystatin cream (MYCOSTATIN) Apply 1 application topically 2 (two) times daily. 08/27/18   Cuthriell, Delorise RoyalsJonathan D, PA-C  sulfamethoxazole-trimethoprim (BACTRIM DS) 800-160 MG tablet Take 1 tablet by mouth 2 (two) times daily. 08/27/18   Cuthriell, Delorise RoyalsJonathan D, PA-C    Allergies Patient has no known allergies.  No family history on file.  Social History Social History   Tobacco Use  . Smoking status: Never Smoker  . Smokeless tobacco: Never Used  Substance Use Topics  . Alcohol use: Yes    Comment: rarely  . Drug use: No     Review of Systems   Constitutional: No fever/chills Eyes: No visual changes. No discharge ENT: No upper respiratory complaints. Cardiovascular: no chest pain. Respiratory: no cough. No SOB. Gastrointestinal: No abdominal pain.  No nausea, no vomiting.  No diarrhea.  No constipation. Genitourinary: Negative for dysuria. No hematuria.  Positive for erythema, pain to the glans of the penis Musculoskeletal: Negative for musculoskeletal pain. Skin: Negative for rash, abrasions, lacerations, ecchymosis. Neurological: Negative for headaches, focal weakness or numbness. 10-point ROS otherwise negative.  ____________________________________________   PHYSICAL EXAM:  VITAL SIGNS: ED Triage Vitals  Enc Vitals Group     BP 08/27/18 1857 134/78     Pulse Rate 08/27/18 1857 (!) 102     Resp 08/27/18 1857 20     Temp 08/27/18 1857 99 F (37.2 C)     Temp Source 08/27/18 1857 Oral     SpO2 08/27/18 1857 100 %     Weight 08/27/18 1858 (!) 330 lb (149.7 kg)     Height 08/27/18 1858 5\' 7"  (1.702 m)     Head Circumference --      Peak Flow --      Pain Score 08/27/18 1858 4     Pain Loc --      Pain Edu? --      Excl. in GC? --      Constitutional: Alert and oriented. Well appearing and in no acute distress. Eyes: Conjunctivae are normal. PERRL. EOMI. Head: Atraumatic. ENT:  Ears:       Nose: No congestion/rhinnorhea.      Mouth/Throat: Mucous membranes are moist.  Neck: No stridor.    Cardiovascular: Normal rate, regular rhythm. Normal S1 and S2.  Good peripheral circulation. Respiratory: Normal respiratory effort without tachypnea or retractions. Lungs CTAB. Good air entry to the bases with no decreased or absent breath sounds. Gastrointestinal: Bowel sounds 4 quadrants. Soft and nontender to palpation. No guarding or rigidity. No palpable masses. No distention. No CVA tenderness. Genitourinary: Visualization of the penile shaft reveals erythema and edema about the glans of the penis with white  moist topical lesion.  Findings are consistent with balanitis.  No other visible findings shaft of penis.  No testicular tenderness to palpation.  No palpable abnormalities. Musculoskeletal: Full range of motion to all extremities. No gross deformities appreciated. Neurologic:  Normal speech and language. No gross focal neurologic deficits are appreciated.  Skin:  Skin is warm, dry and intact. No rash noted. Psychiatric: Mood and affect are normal. Speech and behavior are normal. Patient exhibits appropriate insight and judgement.   ____________________________________________   LABS (all labs ordered are listed, but only abnormal results are displayed)  Labs Reviewed  URINALYSIS, COMPLETE (UACMP) WITH MICROSCOPIC - Abnormal; Notable for the following components:      Result Value   Color, Urine STRAW (*)    APPearance HAZY (*)    Glucose, UA >=500 (*)    Hgb urine dipstick SMALL (*)    Leukocytes,Ua MODERATE (*)    All other components within normal limits   ____________________________________________  EKG   ____________________________________________  RADIOLOGY Balanitis  No results found.  ____________________________________________    PROCEDURES  Procedure(s) performed:    Procedures    Medications - No data to display   ____________________________________________   INITIAL IMPRESSION / ASSESSMENT AND PLAN / ED COURSE  Pertinent labs & imaging results that were available during my care of the patient were reviewed by me and considered in my medical decision making (see chart for details).  Review of the [place] CSRS was performed in accordance of the [facility] prior to dispensing any controlled drugs.           Patient's diagnosis is consistent with balanitis.  Patient presented to the emergency department with pain, erythema and edema of the glans penis.  Patient was having no other associated symptoms.  Urinalysis returns without any significant  indication of urinary tract infection.  Patient urine glucose was elevated.  Patient is not having any symptoms currently of diabetes/DKA, however with this finding I recommend following up with primary care for further evaluation. Patient will be discharged home with prescriptions for Bactroban, topical nystatin, oral Bactrim. Patient is to follow up with primary care as needed or otherwise directed. Patient is given ED precautions to return to the ED for any worsening or new symptoms.     ____________________________________________  FINAL CLINICAL IMPRESSION(S) / ED DIAGNOSES  Final diagnoses:  Balanitis      NEW MEDICATIONS STARTED DURING THIS VISIT:  ED Discharge Orders         Ordered    sulfamethoxazole-trimethoprim (BACTRIM DS) 800-160 MG tablet  2 times daily     08/27/18 2149    mupirocin cream (BACTROBAN) 2 %     08/27/18 2149    nystatin cream (MYCOSTATIN)  2 times daily     08/27/18 2149              This chart was dictated using voice  recognition software/Dragon. Despite best efforts to proofread, errors can occur which can change the meaning. Any change was purely unintentional.    Darletta Moll, PA-C 08/27/18 2153    Earleen Newport, MD 08/27/18 [phone]

## 2018-11-21 ENCOUNTER — Emergency Department
Admission: EM | Admit: 2018-11-21 | Discharge: 2018-11-21 | Disposition: A | Discharge status: Home / Self Care | Payer: 59 | Attending: Emergency Medicine | Admitting: Emergency Medicine

## 2018-11-21 ENCOUNTER — Encounter: Payer: Self-pay | Admitting: Emergency Medicine

## 2018-11-21 ENCOUNTER — Other Ambulatory Visit: Payer: Self-pay

## 2018-11-21 DIAGNOSIS — Y9389 Activity, other specified: Secondary | ICD-10-CM | POA: Diagnosis not present

## 2018-11-21 DIAGNOSIS — Y929 Unspecified place or not applicable: Secondary | ICD-10-CM | POA: Diagnosis not present

## 2018-11-21 DIAGNOSIS — E119 Type 2 diabetes mellitus without complications: Secondary | ICD-10-CM | POA: Insufficient documentation

## 2018-11-21 DIAGNOSIS — S39012A Strain of muscle, fascia and tendon of lower back, initial encounter: Secondary | ICD-10-CM | POA: Diagnosis not present

## 2018-11-21 DIAGNOSIS — S3992XA Unspecified injury of lower back, initial encounter: Secondary | ICD-10-CM | POA: Diagnosis present

## 2018-11-21 DIAGNOSIS — X500XXA Overexertion from strenuous movement or load, initial encounter: Secondary | ICD-10-CM | POA: Diagnosis not present

## 2018-11-21 DIAGNOSIS — Y990 Civilian activity done for income or pay: Secondary | ICD-10-CM | POA: Diagnosis not present

## 2018-11-21 LAB — LIPASE, BLOOD: Lipase: 24 U/L (ref 11–51)

## 2018-11-21 LAB — URINALYSIS, COMPLETE (UACMP) WITH MICROSCOPIC
Bacteria, UA: NONE SEEN
Bilirubin Urine: NEGATIVE
Glucose, UA: NEGATIVE mg/dL
Hgb urine dipstick: NEGATIVE
Ketones, ur: NEGATIVE mg/dL
Leukocytes,Ua: NEGATIVE
Nitrite: NEGATIVE
Protein, ur: NEGATIVE mg/dL
Specific Gravity, Urine: 1.019 (ref 1.005–1.030)
pH: 5 (ref 5.0–8.0)

## 2018-11-21 LAB — COMPREHENSIVE METABOLIC PANEL
ALT: 47 U/L — ABNORMAL HIGH (ref 0–44)
AST: 25 U/L (ref 15–41)
Albumin: 3.7 g/dL (ref 3.5–5.0)
Alkaline Phosphatase: 49 U/L (ref 38–126)
Anion gap: 7 (ref 5–15)
BUN: 13 mg/dL (ref 6–20)
CO2: 28 mmol/L (ref 22–32)
Calcium: 8.8 mg/dL — ABNORMAL LOW (ref 8.9–10.3)
Chloride: 102 mmol/L (ref 98–111)
Creatinine, Ser: 0.83 mg/dL (ref 0.61–1.24)
GFR calc Af Amer: >60 mL/min (ref >60)
GFR calc non Af Amer: >60 mL/min (ref >60)
Glucose, Bld: 187 mg/dL — ABNORMAL HIGH (ref 70–99)
Potassium: 4.2 mmol/L (ref 3.5–5.1)
Sodium: 137 mmol/L (ref 135–145)
Total Bilirubin: 0.9 mg/dL (ref 0.3–1.2)
Total Protein: 7.8 g/dL (ref 6.5–8.1)

## 2018-11-21 LAB — CBC
HCT: 48.9 % (ref 39.0–52.0)
Hemoglobin: 17.2 g/dL — ABNORMAL HIGH (ref 13.0–17.0)
MCH: 30.5 pg (ref 26.0–34.0)
MCHC: 35.2 g/dL (ref 30.0–36.0)
MCV: 86.7 fL (ref 80.0–100.0)
Platelets: 194 10*3/uL (ref 150–400)
RBC: 5.64 MIL/uL (ref 4.22–5.81)
RDW: 12.6 % (ref 11.5–15.5)
WBC: 7.1 10*3/uL (ref 4.0–10.5)
nRBC: 0 % (ref 0.0–0.2)

## 2018-11-21 MED ORDER — LIDOCAINE 5 % EX PTCH
1.0000 | MEDICATED_PATCH | CUTANEOUS | Status: DC
  Administered 2018-11-21: 1 via TRANSDERMAL
  Filled 2018-11-21 (×2): qty 1

## 2018-11-21 MED ORDER — NAPROXEN 500 MG PO TABS
500.0000 mg | ORAL_TABLET | Freq: Once | ORAL | Status: AC
Start: 2018-11-21 — End: 2018-11-21
  Administered 2018-11-21: 500 mg via ORAL
  Filled 2018-11-21 (×2): qty 1

## 2018-11-21 MED ORDER — LIDOCAINE 5 % EX PTCH: 1.0000 | MEDICATED_PATCH | Freq: Two times a day (BID) | CUTANEOUS | 0 refills | Status: AC

## 2018-11-21 MED ORDER — CYCLOBENZAPRINE HCL 5 MG PO TABS: 5.0000 mg | ORAL_TABLET | Freq: Three times a day (TID) | ORAL | 0 refills | Status: DC | PRN

## 2018-11-21 NOTE — ED Notes (Signed)
Signature pad not working. Pt verbalizes understanding of discharge instructions 

## 2018-11-21 NOTE — ED Provider Notes (Signed)
[facility] Emergency Department Provider Note   ____________________________________________   First MD Initiated Contact with Patient 11/21/18 [phone]     (approximate)  I have reviewed the triage vital signs and the nursing notes.   HISTORY  Chief Complaint Back Pain    HPI Bradley Hood is a 33 y.o. male with past medical history of type 2 diabetes who presents to the ED complaining of back pain.  Patient reports that he has been dealing with pain in his right lower back for approximately the past 2 weeks.  He describes it as a dull ache that becomes sharp and more severe with twisting and bending motions.  He has not had any weakness or numbness in his lower extremities, pain does not shoot down to either leg.  He also denies any saddle anesthesia, bowel or bladder incontinence.  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  He has been taking Tylenol at home without relief.        History reviewed. No pertinent past medical history.  There are no active problems to display for this patient.   Past Surgical History:  Procedure Laterality Date  . NO PAST SURGERIES      Prior to Admission medications   Medication Sig Start Date End Date Taking? Authorizing Provider  cyclobenzaprine (FLEXERIL) 5 MG tablet Take 1 tablet (5 mg total) by mouth 3 (three) times daily as needed for muscle spasms. 11/21/18   Blake Divine, MD  lidocaine (LIDODERM) 5 % Place 1 patch onto the skin every 12 (twelve) hours for 5 days. Remove & Discard patch within 12 hours or as directed by MD 11/21/18 11/26/18  Blake Divine, MD  mupirocin cream Drue Stager) 2 % Apply to affected area 3 times daily 08/27/18   Cuthriell, Charline Bills, PA-C  nystatin cream (MYCOSTATIN) Apply 1 application topically 2 (two) times daily. 08/27/18   Cuthriell, Charline Bills, PA-C    Allergies Patient has no known allergies.  History  reviewed. No pertinent family history.  Social History Social History   Tobacco Use  . Smoking status: Never Smoker  . Smokeless tobacco: Never Used  Substance Use Topics  . Alcohol use: Yes    Comment: rarely  . Drug use: No    Review of Systems  Constitutional: No fever/chills Eyes: No visual changes. ENT: No sore throat. Cardiovascular: Denies chest pain. Respiratory: Denies shortness of breath. Gastrointestinal: No abdominal pain.  No nausea, no vomiting.  No diarrhea.  No constipation. Genitourinary: Negative for dysuria. Musculoskeletal: Positive for back pain. Skin: Negative for rash. Neurological: Negative for headaches, focal weakness or numbness.  ____________________________________________   PHYSICAL EXAM:  VITAL SIGNS: ED Triage Vitals [11/21/18 0641]  Enc Vitals Group     BP 131/70     Pulse Rate 84     Resp 20     Temp 99.7 F (37.6 C)     Temp Source Oral     SpO2 94 %     Weight      Height      Head Circumference      Peak Flow      Pain Score      Pain Loc      Pain Edu?      Excl. in [place]?     Constitutional: Alert and oriented. Eyes: Conjunctivae are normal. Head: Atraumatic. Nose: No congestion/rhinnorhea. Mouth/Throat: Mucous membranes are moist. Neck:  Normal ROM Cardiovascular: Normal rate, regular rhythm. Grossly normal heart sounds.  2+ DP pulses bilaterally. Respiratory: Normal respiratory effort.  No retractions. Lungs CTAB. Gastrointestinal: Soft and nontender. No distention. Genitourinary: deferred Musculoskeletal: No lower extremity tenderness nor edema.  Right lumbar paraspinal tenderness to palpation with no midline thoracic or lumbar tenderness to palpation. Neurologic:  Normal speech and language. No gross focal neurologic deficits are appreciated.  5-5 strength in bilateral lower extremities, ambulates without difficulty. Skin:  Skin is warm, dry and intact. No rash noted. Psychiatric: Mood and affect are normal.  Speech and behavior are normal.  ____________________________________________   LABS (all labs ordered are listed, but only abnormal results are displayed)  Labs Reviewed  COMPREHENSIVE METABOLIC PANEL - Abnormal; Notable for the following components:      Result Value   Glucose, Bld 187 (*)    Calcium 8.8 (*)    ALT 47 (*)    All other components within normal limits  CBC - Abnormal; Notable for the following components:   Hemoglobin 17.2 (*)    All other components within normal limits  URINALYSIS, COMPLETE (UACMP) WITH MICROSCOPIC - Abnormal; Notable for the following components:   Color, Urine YELLOW (*)    APPearance CLEAR (*)    All other components within normal limits  LIPASE, BLOOD    PROCEDURES  Procedure(s) performed (including Critical Care):  Procedures   ____________________________________________   INITIAL IMPRESSION / ASSESSMENT AND PLAN / ED COURSE       33 year old male with history of type 2 diabetes presents to the ED complaining of 2 and half weeks of worsening right lateral lumbar back pain.  He has no red flag signs or symptoms, strength is intact to bilateral lower extremities with no saddle anesthesia or incontinence.  Doubt cauda equina.  Also doubt AAA as patient is young and non-smoker.  Labs are unremarkable, UA without evidence of infection.  Will treat symptomatically with anti-inflammatories, Lidoderm patch, muscle relaxant.  Counseled patient to follow-up with his PCP and otherwise return to the ED for new or worsening symptoms, patient agrees with plan.      ____________________________________________   FINAL CLINICAL IMPRESSION(S) / ED DIAGNOSES  Final diagnoses:  Strain of lumbar region, initial encounter  Type 2 diabetes mellitus without complication, without long-term current use of insulin [facility])     ED Discharge Orders         Ordered    cyclobenzaprine (FLEXERIL) 5 MG tablet  3 times daily PRN     11/21/18 0758     lidocaine (LIDODERM) 5 %  Every 12 hours     11/21/18 0758           Note:  This document was prepared using Dragon voice recognition software and may include unintentional dictation errors.   Chesley Noon, MD 11/21/18 [phone]

## 2018-11-21 NOTE — ED Notes (Signed)
See triage note  Presents with right lower back pain  States pain started about 2 weeks ago  Unsure of injury  But did have a fall  States pain is non radiating  And denies any urinary sxs'  Ambulates well

## 2018-11-21 NOTE — ED Triage Notes (Signed)
Pt c/o lower right back pain x2 1/2 weeks. Pt denies urinary symptoms.

## 2018-12-02 ENCOUNTER — Emergency Department
Admission: EM | Admit: 2018-12-02 | Discharge: 2018-12-02 | Disposition: A | Discharge status: Home / Self Care | Payer: 59 | Attending: Student in an Organized Health Care Education/Training Program | Admitting: Student in an Organized Health Care Education/Training Program

## 2018-12-02 ENCOUNTER — Other Ambulatory Visit: Payer: Self-pay

## 2018-12-02 ENCOUNTER — Emergency Department: Payer: 59

## 2018-12-02 ENCOUNTER — Encounter: Payer: Self-pay | Admitting: Intensive Care

## 2018-12-02 DIAGNOSIS — Z79899 Other long term (current) drug therapy: Secondary | ICD-10-CM | POA: Diagnosis not present

## 2018-12-02 DIAGNOSIS — R05 Cough: Secondary | ICD-10-CM | POA: Diagnosis present

## 2018-12-02 DIAGNOSIS — U071 COVID-19: Secondary | ICD-10-CM | POA: Insufficient documentation

## 2018-12-02 DIAGNOSIS — E119 Type 2 diabetes mellitus without complications: Secondary | ICD-10-CM | POA: Diagnosis not present

## 2018-12-02 DIAGNOSIS — J111 Influenza due to unidentified influenza virus with other respiratory manifestations: Secondary | ICD-10-CM

## 2018-12-02 HISTORY — DX: Type 2 diabetes mellitus without complications: E11.9

## 2018-12-02 LAB — BASIC METABOLIC PANEL
Anion gap: 11 (ref 5–15)
BUN: 16 mg/dL (ref 6–20)
CO2: 24 mmol/L (ref 22–32)
Calcium: 8.4 mg/dL — ABNORMAL LOW (ref 8.9–10.3)
Chloride: 99 mmol/L (ref 98–111)
Creatinine, Ser: 0.85 mg/dL (ref 0.61–1.24)
GFR calc Af Amer: >60 mL/min (ref >60)
GFR calc non Af Amer: >60 mL/min (ref >60)
Glucose, Bld: 165 mg/dL — ABNORMAL HIGH (ref 70–99)
Potassium: 4 mmol/L (ref 3.5–5.1)
Sodium: 134 mmol/L — ABNORMAL LOW (ref 135–145)

## 2018-12-02 LAB — CBC
HCT: 50.3 % (ref 39.0–52.0)
Hemoglobin: 17.4 g/dL — ABNORMAL HIGH (ref 13.0–17.0)
MCH: 29.9 pg (ref 26.0–34.0)
MCHC: 34.6 g/dL (ref 30.0–36.0)
MCV: 86.4 fL (ref 80.0–100.0)
Platelets: 151 10*3/uL (ref 150–400)
RBC: 5.82 MIL/uL — ABNORMAL HIGH (ref 4.22–5.81)
RDW: 12.9 % (ref 11.5–15.5)
WBC: 5.5 10*3/uL (ref 4.0–10.5)
nRBC: 0 % (ref 0.0–0.2)

## 2018-12-02 LAB — GLUCOSE, CAPILLARY: Glucose-Capillary: 145 mg/dL — ABNORMAL HIGH (ref 70–99)

## 2018-12-02 LAB — SARS CORONAVIRUS 2 (TAT 6-24 HRS): SARS Coronavirus 2: POSITIVE — AB

## 2018-12-02 MED ORDER — ONDANSETRON 4 MG PO TBDP
4.0000 mg | ORAL_TABLET | Freq: Once | ORAL | Status: AC
  Administered 2018-12-02: 12:00:00 4 mg via ORAL
  Filled 2018-12-02: qty 1

## 2018-12-02 MED ORDER — ONDANSETRON HCL 4 MG PO TABS: 4.0000 mg | ORAL_TABLET | Freq: Every day | ORAL | 0 refills | Status: AC | PRN

## 2018-12-02 MED ORDER — ALBUTEROL SULFATE HFA 108 (90 BASE) MCG/ACT IN AERS: 2.0000 | INHALATION_SPRAY | Freq: Four times a day (QID) | RESPIRATORY_TRACT | 0 refills | Status: DC | PRN

## 2018-12-02 MED ORDER — ACETAMINOPHEN 325 MG PO TABS
650.0000 mg | ORAL_TABLET | Freq: Once | ORAL | Status: AC
  Administered 2018-12-02: 650 mg via ORAL
  Filled 2018-12-02: qty 2

## 2018-12-02 MED ORDER — SODIUM CHLORIDE 0.9 % IV BOLUS: 1000.0000 mL | Freq: Once | INTRAVENOUS | Status: DC

## 2018-12-02 MED ORDER — HYDROCODONE-ACETAMINOPHEN 5-325 MG PO TABS
1.0000 | ORAL_TABLET | Freq: Once | ORAL | Status: AC
  Administered 2018-12-02: 1 via ORAL
  Filled 2018-12-02: qty 1

## 2018-12-02 MED ORDER — AZITHROMYCIN 500 MG PO TABS: 500.0000 mg | ORAL_TABLET | Freq: Every day | ORAL | 0 refills | Status: AC

## 2018-12-02 NOTE — ED Provider Notes (Signed)
[facility] Emergency Department Provider Note    First MD Initiated Contact with Patient 12/02/18 1034     (approximate)  I have reviewed the triage vital signs and the nursing notes.   HISTORY  Chief Complaint Fatigue and Back Pain (lower)    HPI Bearett Porcaro is a 33 y.o. male with a history of diabetes presents the ER for cough congestion fatigue past several days.  His mother also sick with similar symptoms was tested for Covid still with test pending yesterday.  Denies any dysuria but does have back pain.  No heavy lifting.  No history of IV drug abuse.  No numbness or tingling.    Past Medical History:  Diagnosis Date   Diabetes mellitus without complication (HCC)    History reviewed. No pertinent family history. Past Surgical History:  Procedure Laterality Date   NO PAST SURGERIES     There are no active problems to display for this patient.     Prior to Admission medications   Medication Sig Start Date End Date Taking? Authorizing Provider  cyclobenzaprine (FLEXERIL) 5 MG tablet Take 1 tablet (5 mg total) by mouth 3 (three) times daily as needed for muscle spasms. 11/21/18   Chesley Noon, MD  mupirocin cream Idelle Jo) 2 % Apply to affected area 3 times daily 08/27/18   Cuthriell, Delorise Royals, PA-C  nystatin cream (MYCOSTATIN) Apply 1 application topically 2 (two) times daily. 08/27/18   Cuthriell, Delorise Royals, PA-C    Allergies Patient has no known allergies.    Social History Social History   Tobacco Use   Smoking status: Never Smoker   Smokeless tobacco: Never Used  Substance Use Topics   Alcohol use: Yes    Comment: rarely   Drug use: No    Review of Systems Patient denies headaches, rhinorrhea, blurry vision, numbness, shortness of breath, chest pain, edema, cough, abdominal pain, nausea, vomiting, diarrhea, dysuria, fevers, rashes or hallucinations unless otherwise stated above in  HPI. ____________________________________________   PHYSICAL EXAM:  VITAL SIGNS: Vitals:   12/02/18 0900  BP: 122/83  Pulse: 92  Resp: 16  Temp: 99.6 F (37.6 C)  SpO2: 95%    Constitutional: Alert and oriented.  Eyes: Conjunctivae are normal.  Head: Atraumatic. Nose: No congestion/rhinnorhea. Mouth/Throat: Mucous membranes are moist.   Neck: No stridor. Painless ROM.  Cardiovascular: Normal rate, regular rhythm. Grossly normal heart sounds.  Good peripheral circulation. Respiratory: Normal respiratory effort.  No retractions. Lungs CTAB. Gastrointestinal: Soft and nontender. No distention. No abdominal bruits. No CVA tenderness. Genitourinary:  Musculoskeletal: No lower extremity tenderness nor edema.  No joint effusions. Neurologic:  Normal speech and language. No gross focal neurologic deficits are appreciated. No facial droop Skin:  Skin is warm, dry and intact. No rash noted. Psychiatric: Mood and affect are normal. Speech and behavior are normal.  ____________________________________________   LABS (all labs ordered are listed, but only abnormal results are displayed)  Results for orders placed or performed during the hospital encounter of 12/02/18 (from the past 24 hour(s))  Glucose, capillary     Status: Abnormal   Collection Time: 12/02/18  9:01 AM  Result Value Ref Range   Glucose-Capillary 145 (H) 70 - 99 mg/dL  Basic metabolic panel     Status: Abnormal   Collection Time: 12/02/18  9:04 AM  Result Value Ref Range   Sodium 134 (L) 135 - 145 mmol/L   Potassium 4.0 3.5 - 5.1 mmol/L   Chloride 99 98 -  111 mmol/L   CO2 24 22 - 32 mmol/L   Glucose, Bld 165 (H) 70 - 99 mg/dL   BUN 16 6 - 20 mg/dL   Creatinine, Ser 0.85 0.61 - 1.24 mg/dL   Calcium 8.4 (L) 8.9 - 10.3 mg/dL   GFR calc non Af Amer >60 >60 mL/min   GFR calc Af Amer >60 >60 mL/min   Anion gap 11 5 - 15  CBC     Status: Abnormal   Collection Time: 12/02/18  9:04 AM  Result Value Ref Range    WBC 5.5 4.0 - 10.5 K/uL   RBC 5.82 (H) 4.22 - 5.81 MIL/uL   Hemoglobin 17.4 (H) 13.0 - 17.0 g/dL   HCT 50.3 39.0 - 52.0 %   MCV 86.4 80.0 - 100.0 fL   MCH 29.9 26.0 - 34.0 pg   MCHC 34.6 30.0 - 36.0 g/dL   RDW 12.9 11.5 - 15.5 %   Platelets 151 150 - 400 K/uL   nRBC 0.0 0.0 - 0.2 %   ____________________________________________ ____________________________________________  RADIOLOGY  I personally reviewed all radiographic images ordered to evaluate for the above acute complaints and reviewed radiology reports and findings.  These findings were personally discussed with the patient.  Please see medical record for radiology report.  ____________________________________________   PROCEDURES  Procedure(s) performed:  Procedures    Critical Care performed: no ____________________________________________   INITIAL IMPRESSION / ASSESSMENT AND PLAN / ED COURSE  Pertinent labs & imaging results that were available during my care of the patient were reviewed by me and considered in my medical decision making (see chart for details).   DDX: URI, pneumonia, flu, COVID-19, UTI, pyelonephritis, lumbago, Epidural abscess  Ben Habermann is a 33 y.o. who presents to the ED with symptoms as described above.  Patient nontoxic-appearing.  Abdominal exam soft and benign.  Primarily seems to be having URI symptoms and I have a high suspicion patient has COVID-19.  No evidence of hypoxia.  Neuro exam is nonfocal.  Not clinically consistent with epidural abscess given lack of IV drug abuse trauma or other risk factors other than diabetes and think that his presentation better explained by probable viral URI.  Patient also has history of low back pain so this is not an acute issue.  X-ray without any evidence of fracture.  Chest x-ray does show infiltrates which would be consistent with respiratory infection.  Will cover for community-acquired pneumonia while COVID-19 labs are pending.  Tolerating PO.   Stable for a trial of outpatient management.  Have discussed with the patient and available family all diagnostics and treatments performed thus far and all questions were answered to the best of my ability. The patient demonstrates understanding and agreement with plan.      The patient was evaluated in Emergency Department today for the symptoms described in the history of present illness. He/she was evaluated in the context of the global COVID-19 pandemic, which necessitated consideration that the patient might be at risk for infection with the SARS-CoV-2 virus that causes COVID-19. Institutional protocols and algorithms that pertain to the evaluation of patients at risk for COVID-19 are in a state of rapid change based on information released by regulatory bodies including the CDC and federal and state organizations. These policies and algorithms were followed during the patient's care in the ED.  As part of my medical decision making, I reviewed the following data within the [facility] notes reviewed and incorporated, Labs reviewed,  notes from prior ED visits and [place] Controlled Substance Database   ____________________________________________   FINAL CLINICAL IMPRESSION(S) / ED DIAGNOSES  Final diagnoses:  Influenza-like illness      NEW MEDICATIONS STARTED DURING THIS VISIT:  New Prescriptions   No medications on file     Note:  This document was prepared using Dragon voice recognition software and may include unintentional dictation errors.    Willy Eddyobinson, Ranbir Chew, MD 12/02/18 1226

## 2018-12-02 NOTE — ED Triage Notes (Signed)
Patient c/o fatigue and lower back pain X2 days with nausea. HX type 2 diabetes. Non compliant with disease

## 2018-12-03 ENCOUNTER — Other Ambulatory Visit: Payer: Self-pay | Admitting: Critical Care Medicine

## 2018-12-03 ENCOUNTER — Encounter: Payer: Self-pay | Admitting: Critical Care Medicine

## 2018-12-03 DIAGNOSIS — E1165 Type 2 diabetes mellitus with hyperglycemia: Secondary | ICD-10-CM

## 2018-12-03 DIAGNOSIS — U071 COVID-19: Secondary | ICD-10-CM

## 2018-12-03 DIAGNOSIS — IMO0002 Reserved for concepts with insufficient information to code with codable children: Secondary | ICD-10-CM | POA: Insufficient documentation

## 2018-12-03 DIAGNOSIS — E669 Obesity, unspecified: Secondary | ICD-10-CM | POA: Insufficient documentation

## 2018-12-03 DIAGNOSIS — J1282 Pneumonia due to coronavirus disease 2019: Secondary | ICD-10-CM

## 2018-12-03 DIAGNOSIS — Z6841 Body Mass Index (BMI) 40.0 and over, adult: Secondary | ICD-10-CM

## 2018-12-03 NOTE — Progress Notes (Signed)
  I connected by phone with Bradley Hood on 12/03/2018 at 5:48 PM to discuss the potential use of an new treatment for mild to moderate COVID-19 viral infection in non-hospitalized patients.  This patient is a 33 y.o. male that meets the FDA criteria for Emergency Use Authorization of bamlanivimab:  Has a (+) direct SARS-CoV-2 viral test result  Has mild or moderate COVID-19   Is ? 35 years of age and weighs ? 40 kg  Is NOT hospitalized due to COVID-19  Is NOT requiring oxygen therapy or requiring an increase in baseline oxygen flow rate due to COVID-19  Is within 10 days of symptom onset  Has at least one of the high risk factor(s) for progression to severe COVID-19 and/or hospitalization as defined in EUA.  Specific high risk criteria : BMI >/= 35, diabetes  Patient Active Problem List   Diagnosis Date Noted  . Obesity, unspecified 12/03/2018  . Uncontrolled type 2 diabetes mellitus ([place]) 12/03/2018    I have spoken and communicated the following to the patient or parent/caregiver:  1. FDA has authorized the emergency use of bamlanivimab for the treatment of mild to moderate COVID-19 in adults and pediatric patients with positive results of direct SARS-CoV-2 viral testing who are 18 years of age and older weighing at least 40 kg, and who are at high risk for progressing to severe COVID-19 and/or hospitalization.  2. The significant known and potential risks and benefits of bamlanivimab, and the extent to which such potential risks and benefits are unknown.  3. Information on available alternative treatments and the risks and benefits of those alternatives, including clinical trials.  4. Patients treated with bamlanivimab should continue to self-isolate and use infection control measures (e.g., wear mask, isolate, social distance, avoid sharing personal items, clean and disinfect "high touch" surfaces, and frequent handwashing) according to CDC guidelines.   5. The patient or  parent/caregiver has the option to accept or refuse bamlanivimab.  After reviewing this information with the patient, The patient agreed to proceed with receiving the infusion of bamlanivimab and will be provided a copy of the Fact sheet prior to receiving the infusion.  [name] 12/03/2018 5:48 PM

## 2018-12-05 ENCOUNTER — Ambulatory Visit (HOSPITAL_COMMUNITY)
Admission: RE | Admit: 2018-12-05 | Discharge: 2018-12-05 | Disposition: A | Discharge status: Home / Self Care | Payer: 59 | Source: Ambulatory Visit | Attending: Critical Care Medicine | Admitting: Critical Care Medicine

## 2018-12-05 DIAGNOSIS — E119 Type 2 diabetes mellitus without complications: Secondary | ICD-10-CM | POA: Diagnosis not present

## 2018-12-05 DIAGNOSIS — U071 COVID-19: Secondary | ICD-10-CM | POA: Diagnosis not present

## 2018-12-05 DIAGNOSIS — E669 Obesity, unspecified: Secondary | ICD-10-CM | POA: Insufficient documentation

## 2018-12-05 DIAGNOSIS — J1289 Other viral pneumonia: Secondary | ICD-10-CM | POA: Insufficient documentation

## 2018-12-05 MED ORDER — DIPHENHYDRAMINE HCL 50 MG/ML IJ SOLN: 50.0000 mg | Freq: Once | INTRAMUSCULAR | Status: DC | PRN

## 2018-12-05 MED ORDER — SODIUM CHLORIDE 0.9 % IV SOLN
700.0000 mg | Freq: Once | INTRAVENOUS | Status: AC
  Administered 2018-12-05: 700 mg via INTRAVENOUS
  Filled 2018-12-05: qty 20

## 2018-12-05 MED ORDER — FAMOTIDINE IN NACL 20-0.9 MG/50ML-% IV SOLN: 20.0000 mg | Freq: Once | INTRAVENOUS | Status: DC | PRN

## 2018-12-05 MED ORDER — METHYLPREDNISOLONE SODIUM SUCC 125 MG IJ SOLR: 125.0000 mg | Freq: Once | INTRAMUSCULAR | Status: DC | PRN

## 2018-12-05 MED ORDER — ALBUTEROL SULFATE (2.5 MG/3ML) 0.083% IN NEBU: 2.5000 mg | INHALATION_SOLUTION | Freq: Once | RESPIRATORY_TRACT | Status: DC | PRN

## 2018-12-05 MED ORDER — SODIUM CHLORIDE 0.9 % IV SOLN
INTRAVENOUS | Status: DC | PRN
  Administered 2018-12-05: 1000 mL via INTRAVENOUS

## 2018-12-05 MED ORDER — EPINEPHRINE 0.3 MG/0.3ML IJ SOAJ: 0.3000 mg | Freq: Once | INTRAMUSCULAR | Status: DC | PRN

## 2018-12-05 NOTE — Progress Notes (Signed)
Patient has been transfusing for 15 minutes with no adverse reactions at this time. Will continue to monitor.

## 2018-12-05 NOTE — Progress Notes (Signed)
Spoke with Dr. Joya Gaskins regarding patients initial oxygen saturation of 87-88 on room air.  Patient denies shortness of breath.  Saturation increased to 93% with coughing and deep breathing. Dr. Joya Gaskins advised to monitor and notify for changes.

## 2018-12-05 NOTE — Progress Notes (Signed)
  Diagnosis: COVID-19   Physician: [name]  Procedure: bamlanivimab infusion Provided patient with bamlanivimab fact sheet for patients, parents and caregivers prior to infusion.  Complications: No immediate complications noted.  Discharge: Discharged home ambulatory in stable condition  Dishawn Bhargava 12/05/2018

## 2018-12-05 NOTE — Progress Notes (Signed)
Transfusion initiated at 1208pm.

## 2018-12-12 NOTE — Progress Notes (Signed)
When I completed the progress note on 11/24, the active problems of DM type 2, uncontrolled and obesity were under treatment.  This was for the monoclonal AB for Covid

## 2019-03-12 ENCOUNTER — Other Ambulatory Visit: Payer: Self-pay

## 2019-03-12 ENCOUNTER — Emergency Department
Admission: EM | Admit: 2019-03-12 | Discharge: 2019-03-13 | Disposition: A | Discharge status: Home / Self Care | Payer: 59 | Attending: Emergency Medicine | Admitting: Emergency Medicine

## 2019-03-12 ENCOUNTER — Encounter: Payer: Self-pay | Admitting: *Deleted

## 2019-03-12 DIAGNOSIS — R5383 Other fatigue: Secondary | ICD-10-CM

## 2019-03-12 DIAGNOSIS — M7918 Myalgia, other site: Secondary | ICD-10-CM | POA: Insufficient documentation

## 2019-03-12 DIAGNOSIS — E119 Type 2 diabetes mellitus without complications: Secondary | ICD-10-CM | POA: Insufficient documentation

## 2019-03-12 DIAGNOSIS — M2550 Pain in unspecified joint: Secondary | ICD-10-CM

## 2019-03-12 LAB — BASIC METABOLIC PANEL
Anion gap: 6 (ref 5–15)
BUN: 17 mg/dL (ref 6–20)
CO2: 28 mmol/L (ref 22–32)
Calcium: 8.3 mg/dL — ABNORMAL LOW (ref 8.9–10.3)
Chloride: 103 mmol/L (ref 98–111)
Creatinine, Ser: 0.91 mg/dL (ref 0.61–1.24)
GFR calc Af Amer: >60 mL/min (ref >60)
GFR calc non Af Amer: >60 mL/min (ref >60)
Glucose, Bld: 197 mg/dL — ABNORMAL HIGH (ref 70–99)
Potassium: 3.6 mmol/L (ref 3.5–5.1)
Sodium: 137 mmol/L (ref 135–145)

## 2019-03-12 LAB — URINALYSIS, COMPLETE (UACMP) WITH MICROSCOPIC
Bacteria, UA: NONE SEEN
Bilirubin Urine: NEGATIVE
Glucose, UA: NEGATIVE mg/dL
Hgb urine dipstick: NEGATIVE
Ketones, ur: NEGATIVE mg/dL
Leukocytes,Ua: NEGATIVE
Nitrite: NEGATIVE
Protein, ur: NEGATIVE mg/dL
Specific Gravity, Urine: 1.025 (ref 1.005–1.030)
pH: 6 (ref 5.0–8.0)

## 2019-03-12 LAB — CBC
HCT: 48.5 % (ref 39.0–52.0)
Hemoglobin: 16.2 g/dL (ref 13.0–17.0)
MCH: 29.9 pg (ref 26.0–34.0)
MCHC: 33.4 g/dL (ref 30.0–36.0)
MCV: 89.6 fL (ref 80.0–100.0)
Platelets: 171 10*3/uL (ref 150–400)
RBC: 5.41 MIL/uL (ref 4.22–5.81)
RDW: 13.2 % (ref 11.5–15.5)
WBC: 6 10*3/uL (ref 4.0–10.5)
nRBC: 0 % (ref 0.0–0.2)

## 2019-03-12 LAB — TROPONIN I (HIGH SENSITIVITY): Troponin I (High Sensitivity): <2 ng/L (ref <18)

## 2019-03-12 LAB — GLUCOSE, CAPILLARY: Glucose-Capillary: 196 mg/dL — ABNORMAL HIGH (ref 70–99)

## 2019-03-12 NOTE — ED Provider Notes (Signed)
[facility] Emergency Department Provider Note   ____________________________________________   First MD Initiated Contact with Patient 03/12/19 2345     (approximate)  I have reviewed the triage vital signs and the nursing notes.   HISTORY  Chief Complaint Weakness    HPI Bradley Hood is a 34 y.o. male here for evaluation of feeling fatigue  Patient reports that yesterday started to feel little bit warm like warm on his face and hands.  He checked and he did not have any temperatures or fever with it though.  He also reports a couple days ago he had an achy feeling in his right ankle and some in his right wrist which have since improved  He did not see any swelling or rash.   Reports his joints feel better now.  He did have Covid in November, but has been doing well since.  Stopped taking his diabetes medication a long time ago, reports he just stopped it because he read about some of the possible side effects.  He has it available but has not wanted to resume it  Does have a primary care whom he can follow-up with.  No chest pain no trouble breathing.  No fevers or chills.  No abdominal pain or nausea.  Still eating and drinking well.  Staying hydrated.  No shortness of breath with walking or laying down.  No leg swelling.  Past Medical History:  Diagnosis Date  . Diabetes mellitus without complication [facility])     Patient Active Problem List   Diagnosis Date Noted  . Obesity, unspecified 12/03/2018  . Uncontrolled type 2 diabetes mellitus ([place]) 12/03/2018    Past Surgical History:  Procedure Laterality Date  . NO PAST SURGERIES      Prior to Admission medications   Medication Sig Start Date End Date Taking? Authorizing Provider  albuterol (VENTOLIN HFA) 108 (90 Base) MCG/ACT inhaler Inhale 2 puffs into the lungs every 6 (six) hours as needed for wheezing or shortness of breath. 12/02/18   Merlyn Lot, MD  cyclobenzaprine (FLEXERIL) 5 MG  tablet Take 1 tablet (5 mg total) by mouth 3 (three) times daily as needed for muscle spasms. 11/21/18   Blake Divine, MD  mupirocin cream Drue Stager) 2 % Apply to affected area 3 times daily 08/27/18   Cuthriell, Charline Bills, PA-C  nystatin cream (MYCOSTATIN) Apply 1 application topically 2 (two) times daily. 08/27/18   Cuthriell, Charline Bills, PA-C  ondansetron (ZOFRAN) 4 MG tablet Take 1 tablet (4 mg total) by mouth daily as needed. 12/02/18 12/02/19  Merlyn Lot, MD    Allergies Patient has no known allergies.  History reviewed. No pertinent family history.  Social History Social History   Tobacco Use  . Smoking status: Never Smoker  . Smokeless tobacco: Never Used  Substance Use Topics  . Alcohol use: Yes    Comment: rarely  . Drug use: No    Review of Systems Constitutional: No fever/chills but felt warm and feeling fatigued for the last couple days Eyes: No visual changes. ENT: No sore throat. Cardiovascular: Denies chest pain. Respiratory: Denies shortness of breath. Gastrointestinal: No abdominal pain.   Genitourinary: Negative for dysuria. Musculoskeletal: Negative for back pain.  The achiness in his right ankle and right wrist a few days ago. Skin: Negative for rash. Neurological: Negative for headaches, areas of focal weakness or numbness.    ____________________________________________   PHYSICAL EXAM:  VITAL SIGNS: ED Triage Vitals  Enc Vitals Group  BP 03/12/19 2204 (!) 134/92     Pulse Rate 03/12/19 2204 97     Resp 03/12/19 2204 16     Temp 03/12/19 2204 99 F (37.2 C)     Temp Source 03/12/19 2204 Oral     SpO2 03/12/19 2204 92 %     Weight 03/12/19 2205 (!) 310 lb (140.6 kg)     Height 03/12/19 2205 5\' 7"  (1.702 m)     Head Circumference --      Peak Flow --      Pain Score 03/12/19 2205 0     Pain Loc --      Pain Edu? --      Excl. in GC? --     Constitutional: Alert and oriented. Well appearing and in no acute distress. Eyes:  Conjunctivae are normal. Head: Atraumatic. Nose: No congestion/rhinnorhea. Mouth/Throat: Mucous membranes are moist. Neck: No stridor.  Cardiovascular: Normal rate, regular rhythm. Grossly normal heart sounds.  Good peripheral circulation. Respiratory: Normal respiratory effort.  No retractions. Lungs CTAB. Gastrointestinal: Soft and nontender. No distention. Musculoskeletal: No lower extremity tenderness nor edema.  Moves all extremities well.  Right ankle and right wrist joints nontender no effusions, no erythema.  All major joints nonerythematous, full range of motion of all extremities with 5 out of 5 strength. Neurologic:  Normal speech and language. No gross focal neurologic deficits are appreciated.  Skin:  Skin is warm, dry and intact. No rash noted. Psychiatric: Mood and affect are normal. Speech and behavior are normal.  ____________________________________________   LABS (all labs ordered are listed, but only abnormal results are displayed)  Labs Reviewed  BASIC METABOLIC PANEL - Abnormal; Notable for the following components:      Result Value   Glucose, Bld 197 (*)    Calcium 8.3 (*)    All other components within normal limits  URINALYSIS, COMPLETE (UACMP) WITH MICROSCOPIC - Abnormal; Notable for the following components:   Color, Urine YELLOW (*)    APPearance CLEAR (*)    All other components within normal limits  GLUCOSE, CAPILLARY - Abnormal; Notable for the following components:   Glucose-Capillary 196 (*)    All other components within normal limits  CBC  CBG MONITORING, ED  TROPONIN I (HIGH SENSITIVITY)  TROPONIN I (HIGH SENSITIVITY)   ____________________________________________  EKG  Reviewed and interpterdyed by me at 2340 Heart rate 85 QRS 80 QTc 400 Normal sinus rhythm, mild early repolarization abnormality ____________________________________________  RADIOLOGY   ____________________________________________   PROCEDURES  Procedure(s)  performed: None  Procedures  Critical Care performed: No  ____________________________________________   INITIAL IMPRESSION / ASSESSMENT AND PLAN / ED COURSE  Pertinent labs & imaging results that were available during my care of the patient were reviewed by me and considered in my medical decision making (see chart for details).   Patient was for evaluation of fatigue.  Reassuring examination and vital signs.  Reassuring lab work.  Denies any acute symptoms that would suggest cardiac pulmonary vascular or infectious etiology.  Very reassuring exam.  Having some achiness and pains in his right wrist right ankle with those of resolved now and he has normal joint exams.  Discussed with the patient, recommended close follow-up with primary and careful return precautions which he is comfortable with.  EKG reassuring.  No evidence of acute instability  Return precautions and treatment recommendations and follow-up discussed with the patient who is agreeable with the plan.      ____________________________________________   FINAL  CLINICAL IMPRESSION(S) / ED DIAGNOSES  Final diagnoses:  Fatigue, unspecified type  Arthralgia, unspecified joint        Note:  This document was prepared using Dragon voice recognition software and may include unintentional dictation errors       Sharyn Creamer, MD 03/13/19 0008

## 2019-03-12 NOTE — ED Triage Notes (Signed)
Pt to ED reporting weakness starting yesterday with numbness in both hands and feeling "warm" but denies checking temp. Pt had COVID in November of last year.   No cough, SOB, NVD or decreased appetite. Pt is a type II diabetic and reports he stopped taking his Metformin due to "side effects I read about" and denies checking his glucose at home because of a problem with his glucometer.

## 2020-04-07 ENCOUNTER — Emergency Department
Admission: EM | Admit: 2020-04-07 | Discharge: 2020-04-07 | Disposition: A | Discharge status: Home / Self Care | Payer: 59 | Attending: Emergency Medicine | Admitting: Emergency Medicine

## 2020-04-07 ENCOUNTER — Other Ambulatory Visit: Payer: Self-pay

## 2020-04-07 ENCOUNTER — Encounter: Payer: Self-pay | Admitting: Emergency Medicine

## 2020-04-07 DIAGNOSIS — L0201 Cutaneous abscess of face: Secondary | ICD-10-CM | POA: Diagnosis present

## 2020-04-07 DIAGNOSIS — E119 Type 2 diabetes mellitus without complications: Secondary | ICD-10-CM | POA: Insufficient documentation

## 2020-04-07 MED ORDER — CEPHALEXIN 500 MG PO CAPS: 500.0000 mg | ORAL_CAPSULE | Freq: Four times a day (QID) | ORAL | 0 refills | Status: AC

## 2020-04-07 NOTE — ED Provider Notes (Signed)
[name] Emergency Department Provider Note  ____________________________________________   Event Date/Time   First MD Initiated Contact with Patient 04/07/20 1915     (approximate)  I have reviewed the triage vital signs and the nursing notes.   HISTORY  Chief Complaint No chief complaint on file.    HPI Bradley Hood is a 35 y.o. male presents emergency department complaining of a abscess in the right side of his beard.  He denies any drainage from the area.  Areas been there for approximately 1 week.  Denies any new tooth pain.  No fever or chills.    Past Medical History:  Diagnosis Date  . Diabetes mellitus without complication [facility])     Patient Active Problem List   Diagnosis Date Noted  . Obesity, unspecified 12/03/2018  . Uncontrolled type 2 diabetes mellitus ([facility]) 12/03/2018    Past Surgical History:  Procedure Laterality Date  . NO PAST SURGERIES      Prior to Admission medications   Medication Sig Start Date End Date Taking? Authorizing Provider  cephALEXin (KEFLEX) 500 MG capsule Take 1 capsule (500 mg total) by mouth 4 (four) times daily for 10 days. 04/07/20 04/17/20 Yes Delbert Darley, Roselyn Bering, PA-C  albuterol (VENTOLIN HFA) 108 (90 Base) MCG/ACT inhaler Inhale 2 puffs into the lungs every 6 (six) hours as needed for wheezing or shortness of breath. 12/02/18   Willy Eddy, MD  cyclobenzaprine (FLEXERIL) 5 MG tablet Take 1 tablet (5 mg total) by mouth 3 (three) times daily as needed for muscle spasms. 11/21/18   Chesley Noon, MD  mupirocin cream Idelle Jo) 2 % Apply to affected area 3 times daily 08/27/18   Cuthriell, Delorise Royals, PA-C  nystatin cream (MYCOSTATIN) Apply 1 application topically 2 (two) times daily. 08/27/18   Cuthriell, Delorise Royals, PA-C    Allergies Patient has no known allergies.  No family history on file.  Social History Social History   Tobacco Use  . Smoking status: Never Smoker  . Smokeless tobacco:  Never Used  Substance Use Topics  . Alcohol use: Yes    Comment: rarely  . Drug use: No    Review of Systems  Constitutional: No fever/chills Eyes: No visual changes. ENT: No sore throat. Respiratory: Denies cough Genitourinary: Negative for dysuria. Musculoskeletal: Negative for back pain. Skin: Negative for rash. Psychiatric: no mood changes,     ____________________________________________   PHYSICAL EXAM:  VITAL SIGNS: ED Triage Vitals  Enc Vitals Group     BP 04/07/20 1858 (!) 149/92     Pulse Rate 04/07/20 1858 85     Resp 04/07/20 1858 16     Temp 04/07/20 1858 98.2 F (36.8 C)     Temp Source 04/07/20 1858 Oral     SpO2 04/07/20 1858 94 %     Weight 04/07/20 1858 300 lb (136.1 kg)     Height 04/07/20 1858 5\' 7"  (1.702 m)     Head Circumference --      Peak Flow --      Pain Score 04/07/20 1857 7     Pain Loc --      Pain Edu? --      Excl. in GC? --     Constitutional: Alert and oriented. Well appearing and in no acute distress. Eyes: Conjunctivae are normal.  Head: Atraumatic.  Right side face has a hard knot, when palpating the buccal mucosa area is about the size of a quarter, tender, no pus or drainage noted  Nose: No congestion/rhinnorhea. Mouth/Throat: Mucous membranes are moist.  Poor dentition Neck:  supple no lymphadenopathy noted Cardiovascular: Normal rate, regular rhythm. Heart sounds are normal Respiratory: Normal respiratory effort.  No retractions, lungs c t a  GU: deferred Musculoskeletal: FROM all extremities, warm and well perfused Neurologic:  Normal speech and language.  Skin:  Skin is warm, dry and intact. No rash noted. Psychiatric: Mood and affect are normal. Speech and behavior are normal.  ____________________________________________   LABS (all labs ordered are listed, but only abnormal results are displayed)  Labs Reviewed - No data to  display ____________________________________________   ____________________________________________  RADIOLOGY    ____________________________________________   PROCEDURES  Procedure(s) performed: No  Procedures    ____________________________________________   INITIAL IMPRESSION / ASSESSMENT AND PLAN / ED COURSE  Pertinent labs & imaging results that were available during my care of the patient were reviewed by me and considered in my medical decision making (see chart for details).   Patient is 35 year old male presents abscess to the face.  See HPI.  Physical exam is consistent with abscess.  Patient is stable.  I did discuss findings with patient.  Due to it being a skin abscess versus infection from a tooth I did put him on Keflex 500 4 times daily.  He is to follow-up with the dentist.  Return emergency department the abscess is worsening.  States he understands patient discharged stable condition.     [name] was evaluated in Emergency Department on 04/07/2020 for the symptoms described in the history of present illness. He was evaluated in the context of the global COVID-19 pandemic, which necessitated consideration that the patient might be at risk for infection with the SARS-CoV-2 virus that causes COVID-19. Institutional protocols and algorithms that pertain to the evaluation of patients at risk for COVID-19 are in a state of rapid change based on information released by regulatory bodies including the CDC and federal and state organizations. These policies and algorithms were followed during the patient's care in the ED.    As part of my medical decision making, I reviewed the following data within the electronic MEDICAL RECORD NUMBER Nursing notes reviewed and incorporated, Old chart reviewed, Notes from prior ED visits and [place] Controlled Substance Database  ____________________________________________   FINAL CLINICAL IMPRESSION(S) / ED DIAGNOSES  Final  diagnoses:  Facial abscess      NEW MEDICATIONS STARTED DURING THIS VISIT:  Discharge Medication List as of 04/07/2020  7:21 PM    START taking these medications   Details  cephALEXin (KEFLEX) 500 MG capsule Take 1 capsule (500 mg total) by mouth 4 (four) times daily for 10 days., Starting Mon 04/07/2020, Until Thu 04/17/2020, Normal         Note:  This document was prepared using Dragon voice recognition software and may include unintentional dictation errors.    Faythe Ghee, PA-C 04/07/20 Harriet Butte, MD 04/07/20 [phone]

## 2020-04-07 NOTE — Discharge Instructions (Addendum)
Apply warm compress to the right side of your face. Take the antibiotic as prescribed Return if the area is worsening

## 2020-04-07 NOTE — ED Notes (Signed)
Pt denies fever. Reports discomfort at R jaw where cyst-like spot is under his beard. Denies history of issues with cysts. In NAD.

## 2020-04-07 NOTE — ED Triage Notes (Signed)
C/O abscess to right cheek x 1 week.  SMall raised area palpated to beard area.  No drainage seen.

## 2020-09-10 IMAGING — CR DG LUMBAR SPINE 2-3V
1 series · 3 of 3 positions shown · non-contrast
Comparison: None.

CLINICAL DATA: Low back pain for 2 weeks.  No known injury.

EXAM:
LUMBAR SPINE - 2-3 VIEW

[Series 1: dg lumbar spine 2-3 views · 0.14mm/px · 3 of 3 slices shown]
[im 1/3]
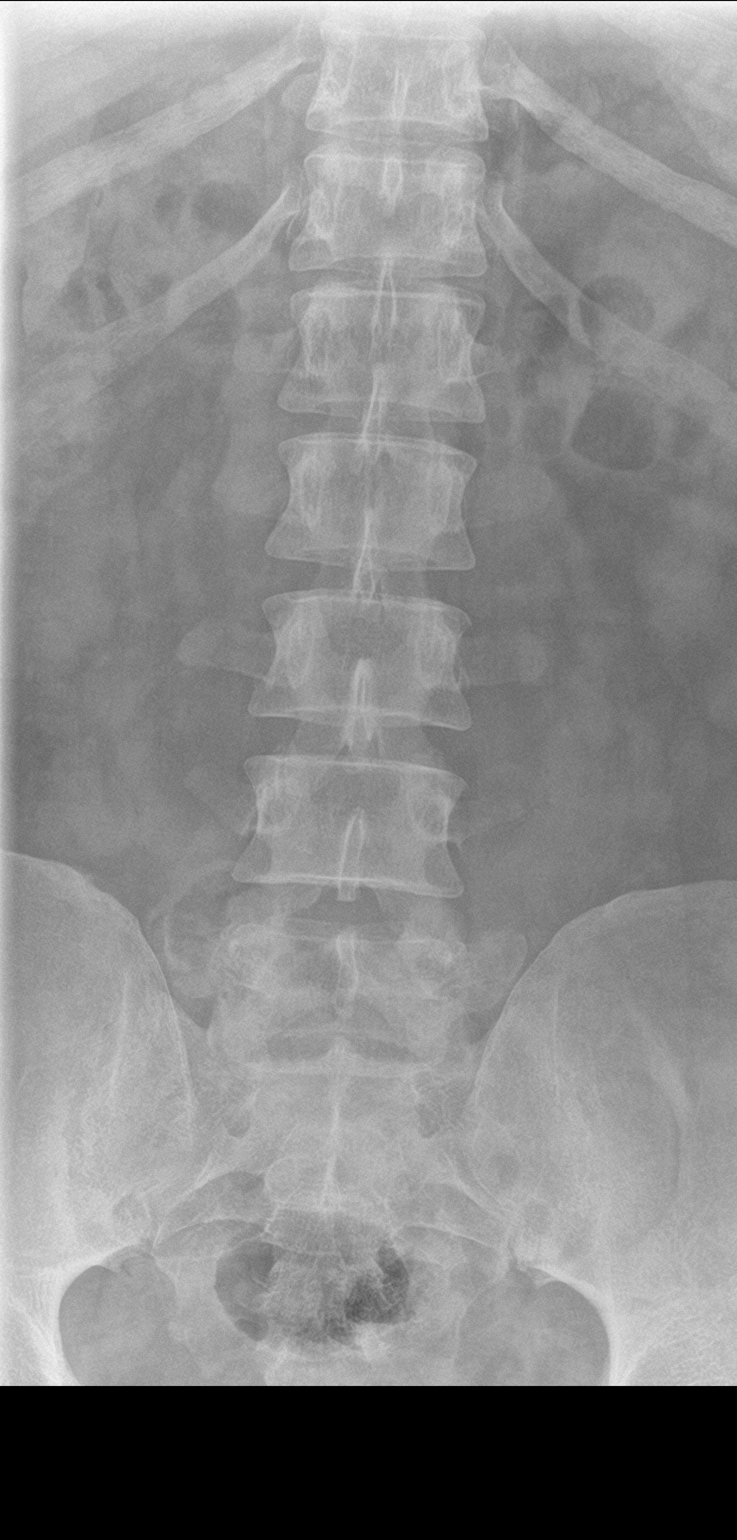
[im 2/3]
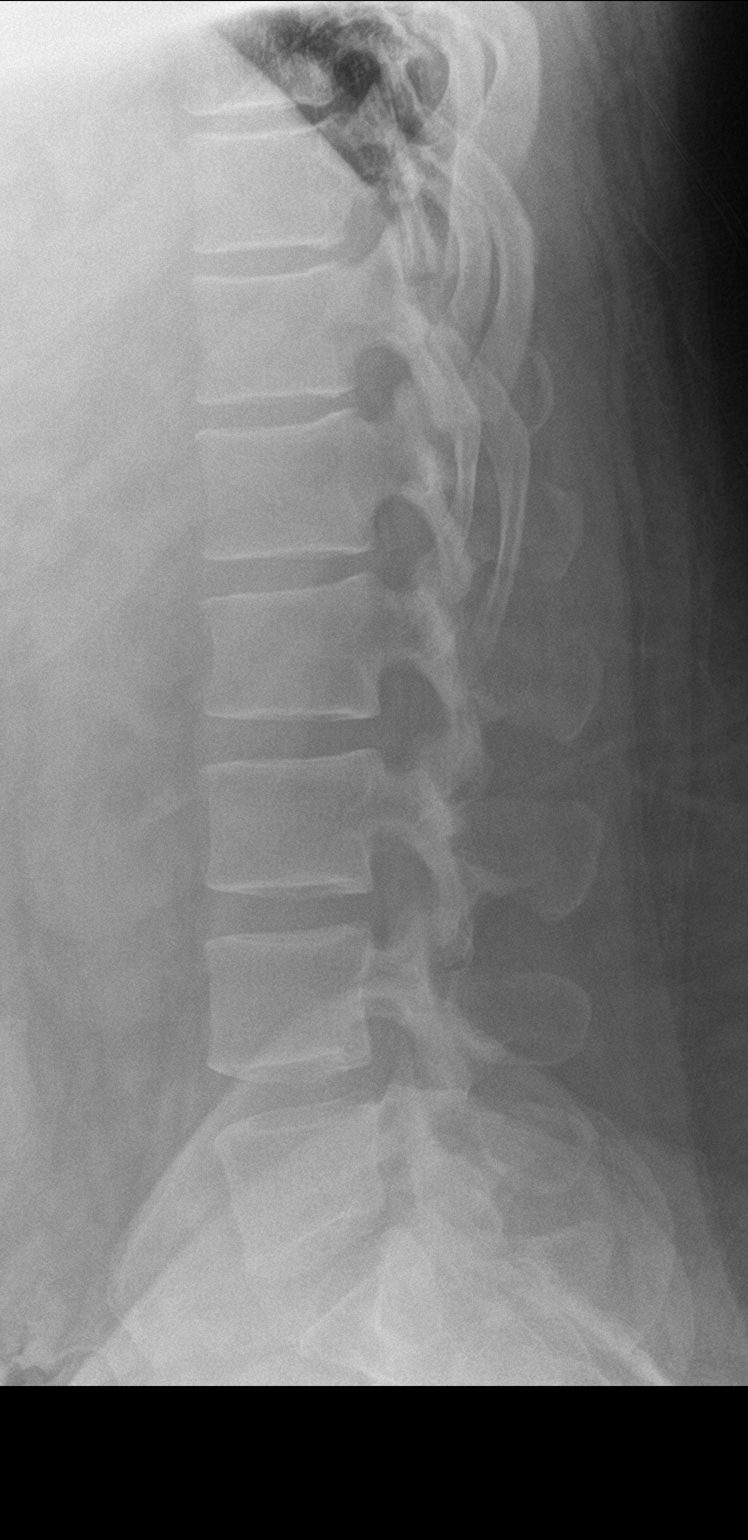
[im 3/3]
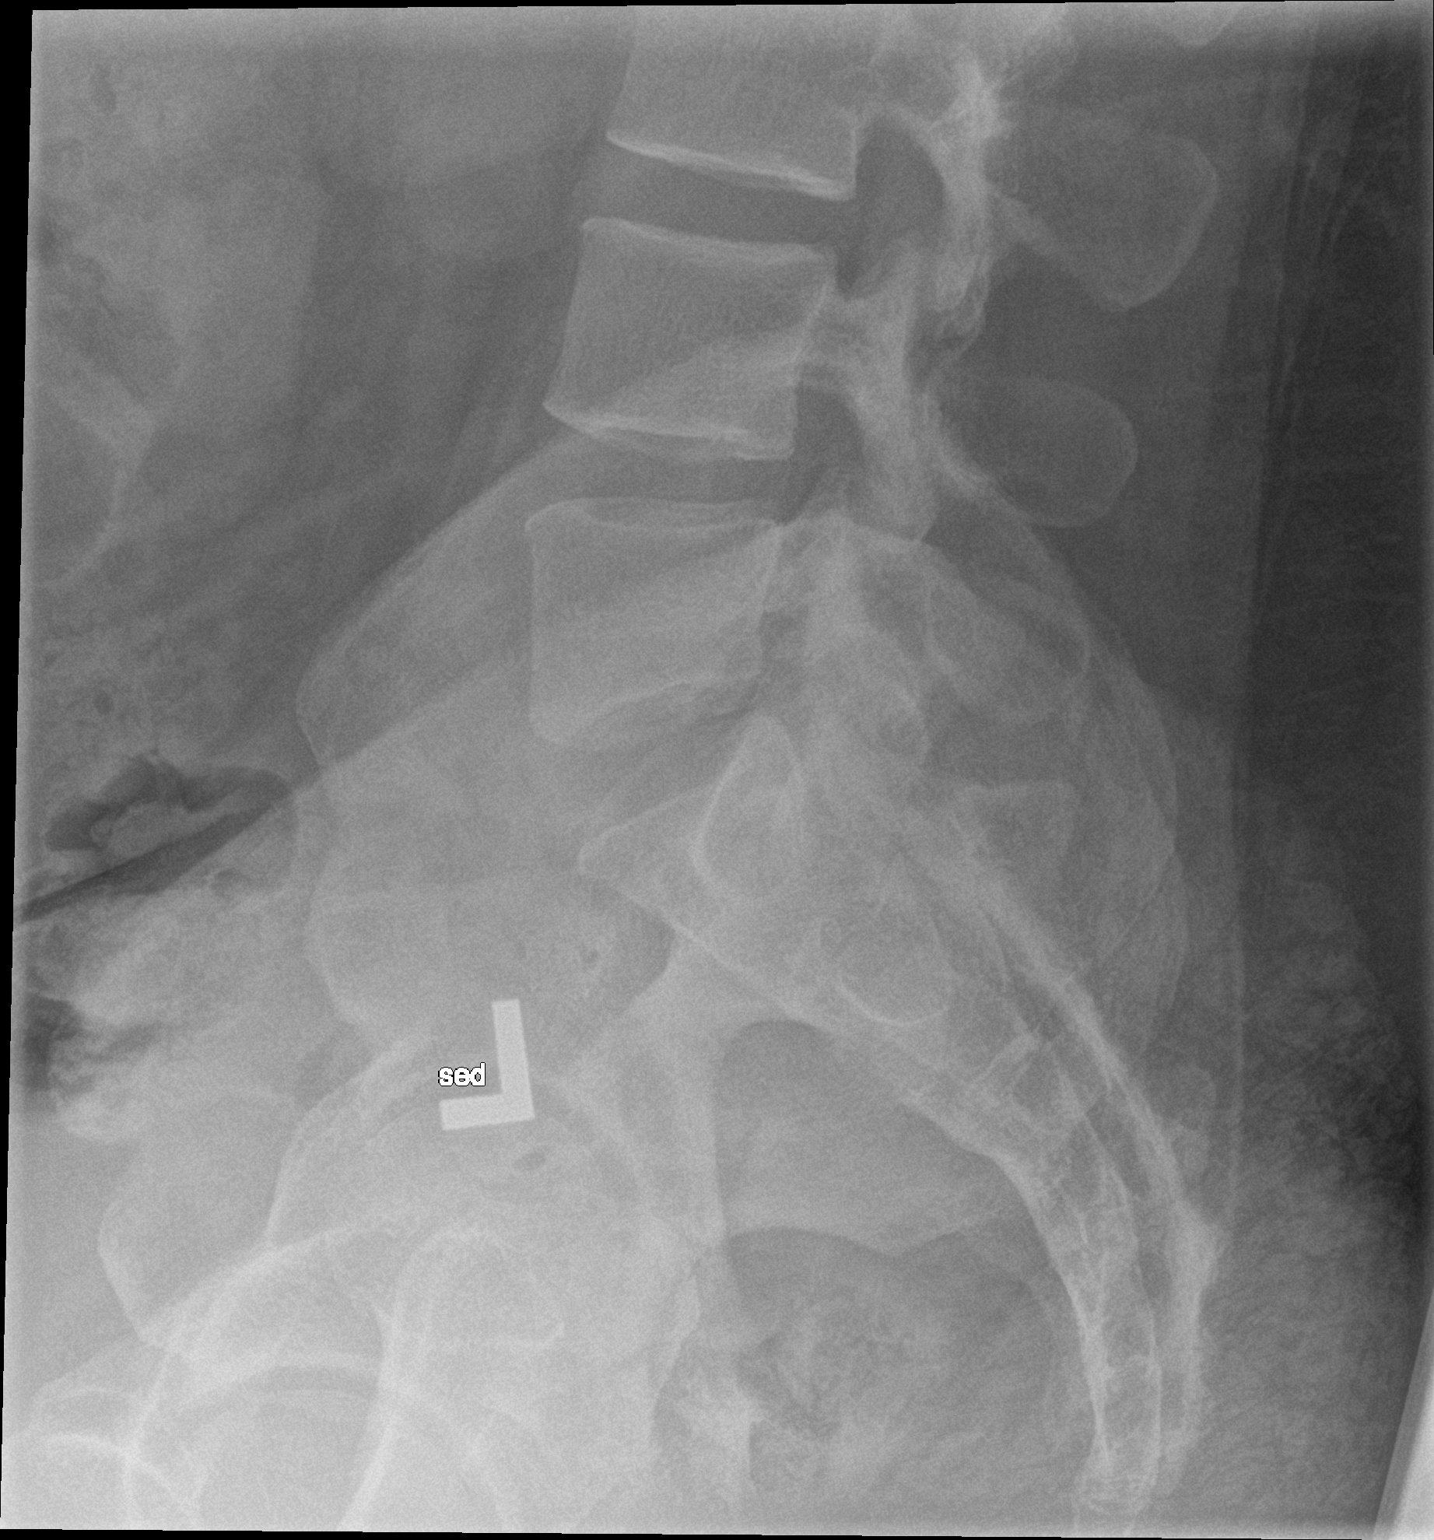

[3 of 3 positions shown; findings below may reference images not displayed]

FINDINGS: There is no evidence of lumbar spine fracture. Alignment is normal.
Intervertebral disc spaces are maintained.
IMPRESSION: Negative.

## 2020-09-29 ENCOUNTER — Other Ambulatory Visit: Payer: Self-pay

## 2020-09-29 ENCOUNTER — Emergency Department: Payer: BLUE CROSS/BLUE SHIELD

## 2020-09-29 ENCOUNTER — Emergency Department
Admission: EM | Admit: 2020-09-29 | Discharge: 2020-09-29 | Disposition: A | Discharge status: Home / Self Care | Payer: BLUE CROSS/BLUE SHIELD | Attending: Emergency Medicine | Admitting: Emergency Medicine

## 2020-09-29 DIAGNOSIS — Z20822 Contact with and (suspected) exposure to covid-19: Secondary | ICD-10-CM | POA: Insufficient documentation

## 2020-09-29 DIAGNOSIS — E119 Type 2 diabetes mellitus without complications: Secondary | ICD-10-CM | POA: Insufficient documentation

## 2020-09-29 DIAGNOSIS — R519 Headache, unspecified: Secondary | ICD-10-CM | POA: Insufficient documentation

## 2020-09-29 DIAGNOSIS — M546 Pain in thoracic spine: Secondary | ICD-10-CM

## 2020-09-29 LAB — SARS CORONAVIRUS 2 (TAT 6-24 HRS): SARS Coronavirus 2: NEGATIVE

## 2020-09-29 MED ORDER — KETOROLAC TROMETHAMINE 30 MG/ML IJ SOLN
INTRAMUSCULAR | Status: AC
  Filled 2020-09-29: qty 1

## 2020-09-29 MED ORDER — NAPROXEN 500 MG PO TABS: 500.0000 mg | ORAL_TABLET | Freq: Two times a day (BID) | ORAL | 2 refills | Status: DC

## 2020-09-29 MED ORDER — KETOROLAC TROMETHAMINE 30 MG/ML IJ SOLN
30.0000 mg | Freq: Once | INTRAMUSCULAR | Status: AC
  Administered 2020-09-29: 30 mg via INTRAMUSCULAR

## 2020-09-29 NOTE — ED Triage Notes (Signed)
Pt states left upper back pain that is worse with inspiration and intermittent headaches since Friday. Pt has a covid positive family member at this time. Pt appears in no acute distress. Pt denies shob, fever.

## 2020-09-29 NOTE — ED Notes (Signed)
PT HAS BLOOD WORK IN LAB IF NEEDED

## 2020-09-29 NOTE — ED Provider Notes (Signed)
[facility] Emergency Department Provider Note   ____________________________________________    I have reviewed the triage vital signs and the nursing notes.   HISTORY  Chief Complaint Back Pain     HPI Bradley Hood is a 35 y.o. male with history of type 2 diabetes who presents with complaints of left upper back pain x1 to 2 days.  He also reports intermittent headaches.  His stepson who lives with him has been diagnosed with COVID.  Denies fevers.  Reports his back pain is worse with twisting his back and bending over.  Also tender to palpation.  Denies falls or trauma.  No significant cough, some myalgias  Past Medical History:  Diagnosis Date   Diabetes mellitus without complication [facility])     Patient Active Problem List   Diagnosis Date Noted   Obesity, unspecified 12/03/2018   Uncontrolled type 2 diabetes mellitus ([facility]) 12/03/2018    Past Surgical History:  Procedure Laterality Date   NO PAST SURGERIES      Prior to Admission medications   Medication Sig Start Date End Date Taking? Authorizing Provider  naproxen (NAPROSYN) 500 MG tablet Take 1 tablet (500 mg total) by mouth 2 (two) times daily with a meal. 09/29/20  Yes Jene Every, MD  albuterol (VENTOLIN HFA) 108 (90 Base) MCG/ACT inhaler Inhale 2 puffs into the lungs every 6 (six) hours as needed for wheezing or shortness of breath. 12/02/18   Willy Eddy, MD  mupirocin cream Idelle Jo) 2 % Apply to affected area 3 times daily 08/27/18   Cuthriell, Delorise Royals, PA-C  nystatin cream (MYCOSTATIN) Apply 1 application topically 2 (two) times daily. 08/27/18   Cuthriell, Delorise Royals, PA-C     Allergies Patient has no known allergies.  No family history on file.  Social History Social History   Tobacco Use   Smoking status: Never   Smokeless tobacco: Never  Substance Use Topics   Alcohol use: Yes    Comment: rarely   Drug use: No    Review of Systems  Constitutional: No  fever/chills Eyes: No visual changes.  ENT: No sore throat. Cardiovascular: Denies chest pain. Respiratory: Denies shortness of breath. Gastrointestinal: No abdominal pain.  No nausea, no vomiting.   Genitourinary: Negative for dysuria. Musculoskeletal: As above Skin: Negative for rash. Neurological: Occasional intermittent headaches   ____________________________________________   PHYSICAL EXAM:  VITAL SIGNS: ED Triage Vitals  Enc Vitals Group     BP 09/29/20 0621 120/84     Pulse Rate 09/29/20 0621 73     Resp 09/29/20 0621 18     Temp 09/29/20 0621 98.8 F (37.1 C)     Temp Source 09/29/20 0621 Oral     SpO2 09/29/20 0621 96 %     Weight 09/29/20 0618 (!) 140.6 kg (310 lb)     Height 09/29/20 0618 1.676 m (5\' 6" )     Head Circumference --      Peak Flow --      Pain Score 09/29/20 0618 8     Pain Loc --      Pain Edu? --      Excl. in GC? --     Constitutional: Alert and oriented. No acute distress. Pleasant and interactive Eyes: Conjunctivae are normal.  Head: Atraumatic. Nose: No congestion/rhinnorhea. Mouth/Throat: Mucous membranes are moist.    Cardiovascular: Normal rate, regular rhythm.   Good peripheral circulation. Respiratory: Normal respiratory effort.  No retractions.  Gastrointestinal: Soft and nontender. No distention.  Musculoskeletal: Back: No vertebral tenderness palpation, mild tenderness palpation inferior to the left scapula, no rash Neurologic:  Normal speech and language. No gross focal neurologic deficits are appreciated.  Skin:  Skin is warm, dry and intact. No rash noted. Psychiatric: Mood and affect are normal. Speech and behavior are normal.  ____________________________________________   LABS (all labs ordered are listed, but only abnormal results are displayed)  Labs Reviewed  SARS CORONAVIRUS 2 (TAT 6-24 HRS)   ____________________________________________  EKG  ED ECG REPORT I, Jene Every, the attending physician,  personally viewed and interpreted this ECG.  Date: 09/29/2020  Rhythm: normal sinus rhythm QRS Axis: normal Intervals: normal ST/T Wave abnormalities: Nonspecific changes Narrative Interpretation: no evidence of acute ischemia  ____________________________________________  RADIOLOGY  Chest x-ray reviewed by me, no pneumonia ____________________________________________   PROCEDURES  Procedure(s) performed: No  Procedures   Critical Care performed: No ____________________________________________   INITIAL IMPRESSION / ASSESSMENT AND PLAN / ED COURSE  Pertinent labs & imaging results that were available during my care of the patient were reviewed by me and considered in my medical decision making (see chart for details).   Patient's presents with back pain as above, intermittent headaches with COVID exposure.  Differential includes musculoskeletal back pain, less likely pneumonia, possible myalgias from COVID.  Will obtain chest x-ray, give IM Toradol, send COVID PCR.  Chest x-ray reviewed by me no acute pneumonia.  Appropriate for discharge at this time.    ____________________________________________   FINAL CLINICAL IMPRESSION(S) / ED DIAGNOSES  Final diagnoses:  Acute left-sided thoracic back pain        Note:  This document was prepared using Dragon voice recognition software and may include unintentional dictation errors.    Jene Every, MD 09/29/20 [phone]

## 2020-12-14 ENCOUNTER — Other Ambulatory Visit: Payer: Self-pay

## 2020-12-14 ENCOUNTER — Emergency Department
Admission: EM | Admit: 2020-12-14 | Discharge: 2020-12-14 | Disposition: A | Discharge status: Home / Self Care | Payer: BC Managed Care – PPO | Attending: Emergency Medicine | Admitting: Emergency Medicine

## 2020-12-14 ENCOUNTER — Encounter: Payer: Self-pay | Admitting: Emergency Medicine

## 2020-12-14 DIAGNOSIS — K0889 Other specified disorders of teeth and supporting structures: Secondary | ICD-10-CM | POA: Diagnosis present

## 2020-12-14 DIAGNOSIS — K029 Dental caries, unspecified: Secondary | ICD-10-CM | POA: Diagnosis not present

## 2020-12-14 DIAGNOSIS — E119 Type 2 diabetes mellitus without complications: Secondary | ICD-10-CM | POA: Insufficient documentation

## 2020-12-14 MED ORDER — AMOXICILLIN-POT CLAVULANATE 875-125 MG PO TABS
1.0000 | ORAL_TABLET | Freq: Once | ORAL | Status: AC
  Administered 2020-12-14: 05:00:00 1 via ORAL
  Filled 2020-12-14: qty 1

## 2020-12-14 MED ORDER — ACETAMINOPHEN 500 MG PO TABS
1000.0000 mg | ORAL_TABLET | Freq: Once | ORAL | Status: AC
  Administered 2020-12-14: 05:00:00 1000 mg via ORAL
  Filled 2020-12-14: qty 2

## 2020-12-14 MED ORDER — LIDOCAINE VISCOUS HCL 2 % MT SOLN
15.0000 mL | Freq: Once | OROMUCOSAL | Status: AC
  Administered 2020-12-14: 05:00:00 15 mL via OROMUCOSAL
  Filled 2020-12-14: qty 15

## 2020-12-14 MED ORDER — LIDOCAINE VISCOUS HCL 2 % MT SOLN: 15.0000 mL | OROMUCOSAL | 0 refills | Status: DC | PRN

## 2020-12-14 MED ORDER — AMOXICILLIN-POT CLAVULANATE 875-125 MG PO TABS: 1.0000 | ORAL_TABLET | Freq: Two times a day (BID) | ORAL | 0 refills | Status: AC

## 2020-12-14 NOTE — ED Provider Notes (Signed)
[facility] Emergency Department Provider Note  ____________________________________________  Time seen: Approximately 4:19 AM  I have reviewed the triage vital signs and the nursing notes.   HISTORY  Chief Complaint Dental Pain   HPI Bradley Hood is a 35 y.o. male with a history of type 2 diabetes who presents for evaluation of dental pain.  Patient reports that he broke his tooth 2 days ago and has been having throbbing pain and some swelling of the right side of his face.  He is concerned that the tooth might be infected.  He has not been able to see his dentist because of the weekend.  He denies any trismus, fever or chills.  Pain is sharp, constant, moderate in intensity  Past Medical History:  Diagnosis Date   Diabetes mellitus without complication [facility])     Patient Active Problem List   Diagnosis Date Noted   Obesity, unspecified 12/03/2018   Uncontrolled type 2 diabetes mellitus 12/03/2018    Past Surgical History:  Procedure Laterality Date   NO PAST SURGERIES      Prior to Admission medications   Medication Sig Start Date End Date Taking? Authorizing Provider  amoxicillin-clavulanate (AUGMENTIN) 875-125 MG tablet Take 1 tablet by mouth 2 (two) times daily for 10 days. 12/14/20 12/24/20 Yes Marion Seese, [place], MD  lidocaine (XYLOCAINE) 2 % solution Use as directed 15 mLs in the mouth or throat as needed for mouth pain. 12/14/20  Yes Don Perking, [place], MD  albuterol (VENTOLIN HFA) 108 (90 Base) MCG/ACT inhaler Inhale 2 puffs into the lungs every 6 (six) hours as needed for wheezing or shortness of breath. 12/02/18   Willy Eddy, MD  mupirocin cream Idelle Jo) 2 % Apply to affected area 3 times daily 08/27/18   Cuthriell, Delorise Royals, PA-C  naproxen (NAPROSYN) 500 MG tablet Take 1 tablet (500 mg total) by mouth 2 (two) times daily with a meal. 09/29/20   Jene Every, MD  nystatin cream (MYCOSTATIN) Apply 1 application topically 2 (two)  times daily. 08/27/18   Cuthriell, Delorise Royals, PA-C    Allergies Patient has no known allergies.  History reviewed. No pertinent family history.  Social History Social History   Tobacco Use   Smoking status: Never   Smokeless tobacco: Never  Substance Use Topics   Alcohol use: Yes    Comment: rarely   Drug use: No    Review of Systems  Constitutional: Negative for fever. Eyes: Negative for visual changes. ENT: Negative for sore throat. + dental pain Neck: No neck pain  Cardiovascular: Negative for chest pain. Respiratory: Negative for shortness of breath. Gastrointestinal: Negative for abdominal pain, vomiting or diarrhea. Genitourinary: Negative for dysuria. Musculoskeletal: Negative for back pain. Skin: Negative for rash. Neurological: Negative for headaches, weakness or numbness. Psych: No SI or HI  ____________________________________________   PHYSICAL EXAM:  VITAL SIGNS: ED Triage Vitals  Enc Vitals Group     BP 12/14/20 0145 133/90     Pulse Rate 12/14/20 0145 75     Resp 12/14/20 0145 18     Temp 12/14/20 0145 97.6 F (36.4 C)     Temp Source 12/14/20 0145 Oral     SpO2 12/14/20 0145 93 %     Weight 12/14/20 0141 (!) 309 lb 15.5 oz (140.6 kg)     Height 12/14/20 0141 5\' 6"  (1.676 m)     Head Circumference --      Peak Flow --      Pain Score 12/14/20 0141  10     Pain Loc --      Pain Edu? --      Excl. in GC? --     Constitutional: Alert and oriented. Well appearing and in no apparent distress. HEENT:      Head: Normocephalic and atraumatic.         Eyes: Conjunctivae are normal. Sclera is non-icteric.       Mouth/Throat: Mucous membranes are moist.  Floor of the mouth is soft with no induration or tenderness, there is no trismus, patient has a right lower molar tooth that is fractured with cavity no signs of abscess      Neck: Supple with no signs of meningismus. Cardiovascular: Regular rate and rhythm.  Respiratory: Normal respiratory  effort.  Musculoskeletal:  No edema, cyanosis, or erythema of extremities. Neurologic: Normal speech and language. Face is symmetric. Moving all extremities. No gross focal neurologic deficits are appreciated. Skin: Skin is warm, dry and intact. No rash noted. Psychiatric: Mood and affect are normal. Speech and behavior are normal.  ____________________________________________   LABS (all labs ordered are listed, but only abnormal results are displayed)  Labs Reviewed - No data to display ____________________________________________  EKG  none  ____________________________________________  RADIOLOGY  none  ____________________________________________   PROCEDURES  Procedure(s) performed: None Procedures   Critical Care performed:  None ____________________________________________   INITIAL IMPRESSION / ASSESSMENT AND PLAN / ED COURSE  35 y.o. male with a history of type 2 diabetes who presents for evaluation of dental pain.  Patient with a fracture right lower molar with no signs of Ludewig's angina, no signs of abscess.  However since patient is a diabetic and might take several days to be able to get with the dentist we will start patient on antibiotics.  We will give him viscous lidocaine for pain and Tylenol extra strength.  Discussed my standard return precautions for any signs of worsening infection.      _____________________________________________ Please note:  Patient was evaluated in Emergency Department today for the symptoms described in the history of present illness. Patient was evaluated in the context of the global COVID-19 pandemic, which necessitated consideration that the patient might be at risk for infection with the SARS-CoV-2 virus that causes COVID-19. Institutional protocols and algorithms that pertain to the evaluation of patients at risk for COVID-19 are in a state of rapid change based on information released by regulatory bodies including the  CDC and federal and state organizations. These policies and algorithms were followed during the patient's care in the ED.  Some ED evaluations and interventions may be delayed as a result of limited staffing during the pandemic.   [place] Controlled Substance Database was reviewed by me. ____________________________________________   FINAL CLINICAL IMPRESSION(S) / ED DIAGNOSES   Final diagnoses:  Dental caries      NEW MEDICATIONS STARTED DURING THIS VISIT:  ED Discharge Orders          Ordered    amoxicillin-clavulanate (AUGMENTIN) 875-125 MG tablet  2 times daily        12/14/20 0418    lidocaine (XYLOCAINE) 2 % solution  As needed        12/14/20 0418             Note:  This document was prepared using Dragon voice recognition software and may include unintentional dictation errors.    Don Perking, [place], MD 12/14/20 [phone]

## 2020-12-14 NOTE — Discharge Instructions (Signed)
OPTIONS FOR DENTAL FOLLOW UP CARE ° °[place] Department [facility] and Human Services - Local Safety Net Dental Clinics °http://www.ncdhhs.gov/dph/oralhealth/services/safetynetclinics.htm °  °[facility] (336-562-3123) ° °[facility] [place] (919-933-9087) ° °[facility] (919-663-1744 ext [phone]) ° °[facility] ([phone]) ° °[facility] ([phone]) °This clinic caters to the indigent population and is on a lottery system. °Location: °[facility], [address], [place] °Clinic Hours: °Wednesdays from 6pm - 9pm, patients seen by a lottery system. °For dates, call or go to [email or web address].[facility].edu/[facility]/patients/Dental-[facility] °Services: °Cleanings, fillings and simple extractions. °Payment Options: °DENTAL WORK IS FREE OF CHARGE. Bring proof of income or support. °Best way to get seen: °Arrive at 5:15 pm - this is a lottery, NOT first come/first serve, so arriving earlier will not increase your chances of being seen. °  °  °[facility] °[phone] °Select option 1 for emergencies °  °Location: °[facility], [address], [place] °Clinic Hours: °No walk-ins accepted - call the day before to schedule an appointment. °Check in times are 9:30 am and 1:30 pm. °Services: °Simple extractions, temporary fillings, pulpectomy/pulp debridement, uncomplicated abscess drainage. °Payment Options: °PAYMENT IS DUE AT THE TIME OF SERVICE.  Fee is usually $100-200, additional surgical procedures (e.g. abscess drainage) may be extra. °Cash, checks, Visa/MasterCard accepted.  Can file Medicaid if patient is covered for dental - patient should call case worker to check. °No discount for [facility] patients. °Best way to get seen: °MUST call the day before and get onto the schedule. Can usually be seen the next 1-2 days. No walk-ins accepted. °  °  °[facility] °[phone] °   °Location: °[facility], [address], [place] °Clinic Hours: °M, W, Th, F 8am or 1:30pm, Tues 9a or 1:30 - first come/first served. °Services: °Simple extractions, temporary fillings, uncomplicated abscess drainage.  You do not need to be an [place] resident. °Payment Options: °PAYMENT IS DUE AT THE TIME OF SERVICE. °Dental insurance, otherwise sliding scale - bring proof of income or support. °Depending on income and treatment needed, cost is usually $50-200. °Best way to get seen: °Arrive early as it is first come/first served. °  °  °[facility] °[phone] °  °Location: °[address] °Clinic Hours: °Mon-Thu 8a-5p °Services: °Most basic dental services including extractions and fillings. °Payment Options: °PAYMENT IS DUE AT THE TIME OF SERVICE. °Sliding scale, up to 50% off - bring proof if income or support. °Medicaid with dental option accepted. °Best way to get seen: °Call to schedule an appointment, can usually be seen within 2 weeks OR they will try to see walk-ins - show up at 8a or 2p (you may have to wait). °  °  °[facility] °[phone] °[place] RESIDENTS ONLY °  °Location: °[facility], [address], [place], [place] [address] °Clinic Hours: By appointment only. °Monday - Thursday 8am-5pm, Friday 8am-12pm °Services: Cleanings, fillings, extractions. °Payment Options: °PAYMENT IS DUE AT THE TIME OF SERVICE. °Cash, Visa or MasterCard. Sliding scale - $30 minimum per service. °Best way to get seen: °Come in to office, complete packet and make an appointment - need proof of income °or support monies for each household member and proof of [place] residence. °Usually takes about a month to get in. °  °  °[facility] °[phone] °  °Location: °[address]   [place] °Clinic Hours: Walk-in Urgent Care Dental Services are offered Monday-Friday  mornings only. °The numbers of emergencies accepted daily is limited to the number of °providers available. °Maximum 15 - Mondays, Wednesdays & Thursdays °Maximum 10 - Tuesdays & Fridays °Services: °You do not need to be a [place] resident to be seen for a dental emergency. °Emergencies are defined as pain, swelling, abnormal bleeding, or dental trauma. Walkins will receive x-rays if needed. °NOTE: Dental cleaning is not an emergency. °Payment Options: °PAYMENT IS DUE AT THE TIME OF SERVICE. °Minimum co-pay is $40.00 for uninsured patients. °Minimum co-pay is $3.00 for Medicaid with dental coverage. °Dental Insurance is accepted and must be presented at time of visit. °Medicare does not cover dental. °Forms of payment: Cash, credit card, checks. °Best way to get seen: °If not previously registered with the clinic, walk-in dental registration begins at 7:15 am and is on a first come/first serve basis. °If previously registered with the clinic, call to make an appointment. °  °  °[facility] °[phone] °[place] RESIDENTS ONLY °  °Location: °[address], [place],  °Clinic Hours: °Mon-Thu 10a-2p °Services: Extractions only! °Payment Options: °FREE (donations accepted) - bring proof of income or support °Best way to get seen: °Call and schedule an appointment OR come at 8am on the 1st Monday of every month (except for holidays) when it is first come/first served. °  °  °[facility] °[phone] °  °Location: °[address], [place] °Clinic Hours: °Friday mornings °Services, Payment Options, Best way to get seen: °Call for info °

## 2020-12-14 NOTE — ED Triage Notes (Signed)
Pt c/o dental pain since Friday. No facial swelling noted at this time.

## 2021-05-24 ENCOUNTER — Emergency Department
Admission: EM | Admit: 2021-05-24 | Discharge: 2021-05-24 | Disposition: A | Discharge status: Home / Self Care | Payer: BC Managed Care – PPO | Attending: Emergency Medicine | Admitting: Emergency Medicine

## 2021-05-24 ENCOUNTER — Other Ambulatory Visit: Payer: Self-pay

## 2021-05-24 DIAGNOSIS — E1165 Type 2 diabetes mellitus with hyperglycemia: Secondary | ICD-10-CM

## 2021-05-24 DIAGNOSIS — K047 Periapical abscess without sinus: Secondary | ICD-10-CM

## 2021-05-24 DIAGNOSIS — K0889 Other specified disorders of teeth and supporting structures: Secondary | ICD-10-CM | POA: Diagnosis present

## 2021-05-24 LAB — CBC
HCT: 51.5 % (ref 39.0–52.0)
Hemoglobin: 17.4 g/dL — ABNORMAL HIGH (ref 13.0–17.0)
MCH: 29.7 pg (ref 26.0–34.0)
MCHC: 33.8 g/dL (ref 30.0–36.0)
MCV: 87.9 fL (ref 80.0–100.0)
Platelets: 213 10*3/uL (ref 150–400)
RBC: 5.86 MIL/uL — ABNORMAL HIGH (ref 4.22–5.81)
RDW: 12.3 % (ref 11.5–15.5)
WBC: 8.6 10*3/uL (ref 4.0–10.5)
nRBC: 0 % (ref 0.0–0.2)

## 2021-05-24 LAB — URINALYSIS, ROUTINE W REFLEX MICROSCOPIC
Bilirubin Urine: NEGATIVE
Glucose, UA: >=500 mg/dL — AB
Ketones, ur: NEGATIVE mg/dL
Nitrite: NEGATIVE
Protein, ur: NEGATIVE mg/dL
Specific Gravity, Urine: 1.03 (ref 1.005–1.030)
pH: 6 (ref 5.0–8.0)

## 2021-05-24 LAB — BASIC METABOLIC PANEL
Anion gap: 7 (ref 5–15)
BUN: 17 mg/dL (ref 6–20)
CO2: 26 mmol/L (ref 22–32)
Calcium: 9.1 mg/dL (ref 8.9–10.3)
Chloride: 100 mmol/L (ref 98–111)
Creatinine, Ser: 1.02 mg/dL (ref 0.61–1.24)
GFR, Estimated: >60 mL/min (ref >60)
Glucose, Bld: 407 mg/dL — ABNORMAL HIGH (ref 70–99)
Potassium: 4.1 mmol/L (ref 3.5–5.1)
Sodium: 133 mmol/L — ABNORMAL LOW (ref 135–145)

## 2021-05-24 LAB — CBG MONITORING, ED
Glucose-Capillary: 430 mg/dL — ABNORMAL HIGH (ref 70–99)
Glucose-Capillary: 421 mg/dL — ABNORMAL HIGH (ref 70–99)
Glucose-Capillary: 261 mg/dL — ABNORMAL HIGH (ref 70–99)

## 2021-05-24 MED ORDER — INSULIN ASPART 100 UNIT/ML IJ SOLN
10.0000 [IU] | Freq: Once | INTRAMUSCULAR | Status: AC
  Administered 2021-05-24: 10 [IU] via INTRAVENOUS
  Filled 2021-05-24: qty 1

## 2021-05-24 MED ORDER — AMOXICILLIN 875 MG PO TABS: 875.0000 mg | ORAL_TABLET | Freq: Two times a day (BID) | ORAL | 0 refills | Status: DC

## 2021-05-24 MED ORDER — HYDROCODONE-ACETAMINOPHEN 5-325 MG PO TABS
1.0000 | ORAL_TABLET | Freq: Once | ORAL | Status: AC
  Administered 2021-05-24: 1 via ORAL
  Filled 2021-05-24: qty 1

## 2021-05-24 MED ORDER — AMOXICILLIN-POT CLAVULANATE 875-125 MG PO TABS
1.0000 | ORAL_TABLET | Freq: Once | ORAL | Status: AC
  Administered 2021-05-24: 1 via ORAL
  Filled 2021-05-24: qty 1

## 2021-05-24 MED ORDER — SODIUM CHLORIDE 0.9 % IV BOLUS
1000.0000 mL | Freq: Once | INTRAVENOUS | Status: AC
  Administered 2021-05-24: 1000 mL via INTRAVENOUS

## 2021-05-24 NOTE — ED Provider Notes (Signed)
[facility] Provider Note  Patient Contact: 8:25 PM (approximate)   History   Dental Pain   HPI  Bradley Hood is a 36 y.o. male who presents the emergency department primarily for right upper dental pain.  Patient states that he has a molar that has been broken in the past and is eroded into the gumline.  Patient does have a dentist but has not seen the dentist for this issue.  He states that he started having increasing pain yesterday and had worsening pain today with overlying erythema to his right cheek.  No difficulty breathing or swallowing.  Patient also states that he is a diabetic, does not routinely check his blood sugar but does take metformin for his diabetes.  Blood sugar was checked in triage and was elevated at 430.  Patient denies any other symptoms such as weakness, dizziness, nausea     Physical Exam   Triage Vital Signs: ED Triage Vitals  Enc Vitals Group     BP 05/24/21 1933 (!) 147/90     Pulse Rate 05/24/21 1933 79     Resp 05/24/21 1933 17     Temp 05/24/21 1933 98.7 F (37.1 C)     Temp Source 05/24/21 1933 Oral     SpO2 05/24/21 1933 98 %     Weight 05/24/21 1934 300 lb (136.1 kg)     Height 05/24/21 1934 5\' 7"  (1.702 m)     Head Circumference --      Peak Flow --      Pain Score 05/24/21 1933 9     Pain Loc --      Pain Edu? --      Excl. in GC? --     Most recent vital signs: Vitals:   05/24/21 1933  BP: (!) 147/90  Pulse: 79  Resp: 17  Temp: 98.7 F (37.1 C)  SpO2: 98%     General: Alert and in no acute distress. ENT:      Ears:       Nose: No congestion/rhinnorhea.      Mouth/Throat: Mucous membranes are moist.  Visualization of the dentition to the patient's mouth reveals erosion of the second molar right upper dentition and into the gumline.  There are some surrounding erythema of the gum though there is no appreciable abscess in the gingival tissue.  Oropharynx with out other gross signs of infection.  No  angioedema Neck: No stridor. No cervical spine tenderness to palpation Hematological/Lymphatic/Immunilogical: Scattered anterior cervical lymphadenopathy bilaterally Cardiovascular:  Good peripheral perfusion Respiratory: Normal respiratory effort without tachypnea or retractions. Lungs CTAB.  Musculoskeletal: Full range of motion to all extremities.  Neurologic:  No gross focal neurologic deficits are appreciated.  Skin:   No rash noted Other:   ED Results / Procedures / Treatments   Labs (all labs ordered are listed, but only abnormal results are displayed) Labs Reviewed  BASIC METABOLIC PANEL - Abnormal; Notable for the following components:      Result Value   Sodium 133 (*)    Glucose, Bld 407 (*)    All other components within normal limits  CBC - Abnormal; Notable for the following components:   RBC 5.86 (*)    Hemoglobin 17.4 (*)    All other components within normal limits  URINALYSIS, ROUTINE W REFLEX MICROSCOPIC - Abnormal; Notable for the following components:   Color, Urine STRAW (*)    APPearance HAZY (*)    Glucose, UA >=500 (*)  Hgb urine dipstick SMALL (*)    Leukocytes,Ua MODERATE (*)    Bacteria, UA MANY (*)    All other components within normal limits  CBG MONITORING, ED - Abnormal; Notable for the following components:   Glucose-Capillary 430 (*)    All other components within normal limits  CBG MONITORING, ED - Abnormal; Notable for the following components:   Glucose-Capillary 421 (*)    All other components within normal limits  CBG MONITORING, ED - Abnormal; Notable for the following components:   Glucose-Capillary 261 (*)    All other components within normal limits  CBG MONITORING, ED     EKG     RADIOLOGY    No results found.  PROCEDURES:  Critical Care performed: No  Procedures   MEDICATIONS ORDERED IN ED: Medications  sodium chloride 0.9 % bolus 1,000 mL (0 mLs Intravenous Stopped 05/24/21 2212)  insulin aspart  (novoLOG) injection 10 Units (10 Units Intravenous Given 05/24/21 2056)  amoxicillin-clavulanate (AUGMENTIN) 875-125 MG per tablet 1 tablet (1 tablet Oral Given 05/24/21 2047)  HYDROcodone-acetaminophen (NORCO/VICODIN) 5-325 MG per tablet 1 tablet (1 tablet Oral Given 05/24/21 2047)     IMPRESSION / MDM / ASSESSMENT AND PLAN / ED COURSE  I reviewed the triage vital signs and the nursing notes.                              Differential diagnosis includes, but is not limited to, dental infection, dental abscess, gingivitis, hyperglycemia, DKA, hyperosmolar state   Patient's diagnosis is consistent with dental infection, hyperglycemia.  Patient presented to the emergency department with primary complaint of right upper dental pain.  Findings were consistent with dental infection.  Patient was a diabetic and glucose was checked with an initial glucose of 430.  Patient had no concerning symptoms of DKA and his labs reveal no elevation in his anion gap.  At this time we treated his hyperglycemia which is likely stress hyperglycemia with fluids and insulin.  Immediately following medications his glucose had already dropped to 261.  At this time I feel patient is stable for discharge..  We discussed stress hyperglycemia in the setting of infection.  Discussed management of infection with antibiotics.  Return precautions discussed with the patient both in regards to infection as well as his hyperglycemia.  Follow-up with primary care as needed... Patient is given ED precautions to return to the ED for any worsening or new symptoms.        FINAL CLINICAL IMPRESSION(S) / ED DIAGNOSES   Final diagnoses:  Dental infection  Hyperglycemia due to diabetes mellitus (HCC)     Rx / DC Orders   ED Discharge Orders          Ordered    amoxicillin (AMOXIL) 875 MG tablet  2 times daily        05/24/21 2302             Note:  This document was prepared using Dragon voice recognition software and may  include unintentional dictation errors.   Lanette Hampshire 05/24/21 2304    Shaune Pollack, MD 06/01/21 [phone]

## 2021-05-24 NOTE — ED Notes (Signed)
Pt reports face swelling that started 2 days ago. Pt reports decreased appetite. Pt states he has not checked his blood sugar until today and that he is diabetic. Reports "I think I have an infection."  ?

## 2021-05-24 NOTE — ED Triage Notes (Signed)
Pt began having dental pain and swelling on upper and lower right hand side of mouth. Pt does see a dentist but has not called regarding the current problem. Pt denies any other symptoms. Pt is a type 2 diabetic and takes metformin daily but does not check blood sugar.  ?

## 2021-05-24 NOTE — ED Notes (Signed)
Patient instructed to provide urine sample.

## 2022-06-17 ENCOUNTER — Emergency Department
Admission: EM | Admit: 2022-06-17 | Discharge: 2022-06-17 | Disposition: A | Discharge status: Home / Self Care | Payer: BLUE CROSS/BLUE SHIELD | Attending: Emergency Medicine | Admitting: Emergency Medicine

## 2022-06-17 ENCOUNTER — Other Ambulatory Visit: Payer: Self-pay

## 2022-06-17 ENCOUNTER — Encounter: Payer: Self-pay | Admitting: Emergency Medicine

## 2022-06-17 DIAGNOSIS — H01001 Unspecified blepharitis right upper eyelid: Secondary | ICD-10-CM

## 2022-06-17 LAB — CBG MONITORING, ED: Glucose-Capillary: 203 mg/dL — ABNORMAL HIGH (ref 70–99)

## 2022-06-17 MED ORDER — OFLOXACIN 0.3 % OP SOLN: 2.0000 [drp] | Freq: Four times a day (QID) | OPHTHALMIC | 0 refills | Status: AC

## 2022-06-17 MED ORDER — OFLOXACIN 0.3 % OP SOLN
2.0000 [drp] | Freq: Once | OPHTHALMIC | Status: AC
  Administered 2022-06-17: 2 [drp] via OPHTHALMIC
  Filled 2022-06-17: qty 5

## 2022-06-17 NOTE — Discharge Instructions (Addendum)
Please use your antibiotic eyedrops 4 times daily as prescribed.  Return to the emergency department for any significant worsening of pain swelling any visual changes or fever.

## 2022-06-17 NOTE — ED Notes (Signed)
See triage note  Presents with some discomfort to right eye lid  States he noticed some swelling with h/a yesterday  Low grade temp on arrival

## 2022-06-17 NOTE — ED Triage Notes (Signed)
Pt comes with c/o right eye pain and swelling. Pt states little redness. Pt state this started 4 days ago. Pt is diabetic and concerned

## 2022-06-17 NOTE — ED Provider Notes (Signed)
   [name] Provider Note    Event Date/Time   First MD Initiated Contact with Patient 06/17/22 1513     (approximate)  History   Chief Complaint: Eye Pain  HPI  Bradley Hood is a 37 y.o. male with a past medical history diabetes who presents to the emergency department for right eyelid swelling.  According to the patient for the past 2 days he has had swelling of his right eyelid.  Patient denies any fever, denies any visual changes denies any drainage or pus.  Patient states he is diabetic has not been checking his blood sugar recently.  Physical Exam   Triage Vital Signs: ED Triage Vitals  Enc Vitals Group     BP 06/17/22 1454 124/84     Pulse Rate 06/17/22 1454 80     Resp 06/17/22 1454 16     Temp 06/17/22 1454 99.1 F (37.3 C)     Temp Source 06/17/22 1454 Oral     SpO2 06/17/22 1454 93 %     Weight 06/17/22 1454 296 lb (134.3 kg)     Height 06/17/22 1454 5\' 7"  (1.702 m)     Head Circumference --      Peak Flow --      Pain Score 06/17/22 1459 6     Pain Loc --      Pain Edu? --      Excl. in GC? --     Most recent vital signs: Vitals:   06/17/22 1454  BP: 124/84  Pulse: 80  Resp: 16  Temp: 99.1 F (37.3 C)  SpO2: 93%    General: Awake, no distress.  CV:  Good peripheral perfusion. Resp:  Normal effort. Abd:  No distention Other:  Mild swelling of the right eyelid very minimal redness.  No conjunctival injection.  Denies visual changes.  Denies eye pain.   ED Results / Procedures / Treatments   MEDICATIONS ORDERED IN ED: Medications  ofloxacin (OCUFLOX) 0.3 % ophthalmic solution 2 drop (has no administration in time range)     IMPRESSION / MDM / ASSESSMENT AND PLAN / ED COURSE  I reviewed the triage vital signs and the nursing notes.  Patient's presentation is most consistent with acute illness / injury with system symptoms.  Patient presents emergency department for 2 days of right eyelid swelling slight discomfort of  the eyelid.  Exam most consistent with a mild blepharitis.  Discussed with the patient use of ofloxacin eyedrops for the next 7 days, also discussed how to clean the eyelid margin.  Patient states he has not checked his blood sugar in quite some time.  We will obtain a CBG to rule out uncontrolled diabetes if significantly elevated patient may require basic lab work as well to be placed back on his diabetic medications.  Otherwise she could follow-up with a PCP.  Patient's fingerstick is reassuring.  Will discharge patient with PCP follow-up and ofloxacin eyedrops.  FINAL CLINICAL IMPRESSION(S) / ED DIAGNOSES   Blepharitis  Rx / DC Orders   Ofloxacin eyedrops  Note:  This document was prepared using Dragon voice recognition software and may include unintentional dictation errors.   [name], MD 06/17/22 1650

## 2022-11-29 ENCOUNTER — Encounter: Payer: Self-pay | Admitting: *Deleted

## 2022-11-29 ENCOUNTER — Ambulatory Visit
Admission: EM | Admit: 2022-11-29 | Discharge: 2022-11-29 | Disposition: A | Discharge status: Home / Self Care | Payer: Self-pay | Attending: Family Medicine | Admitting: Family Medicine

## 2022-11-29 DIAGNOSIS — R070 Pain in throat: Secondary | ICD-10-CM | POA: Insufficient documentation

## 2022-11-29 DIAGNOSIS — J069 Acute upper respiratory infection, unspecified: Secondary | ICD-10-CM | POA: Insufficient documentation

## 2022-11-29 LAB — POCT RAPID STREP A (OFFICE): Rapid Strep A Screen: NEGATIVE

## 2022-11-29 MED ORDER — BENZONATATE 100 MG PO CAPS: 100.0000 mg | ORAL_CAPSULE | Freq: Three times a day (TID) | ORAL | 0 refills | Status: DC | PRN

## 2022-11-29 MED ORDER — IBUPROFEN 800 MG PO TABS: 800.0000 mg | ORAL_TABLET | Freq: Three times a day (TID) | ORAL | 0 refills | Status: DC | PRN

## 2022-11-29 NOTE — Discharge Instructions (Signed)
Your strep test is negative.  Culture of the throat will be sent, and staff will notify you if that is in turn positive.  Take benzonatate 100 mg, 1 tab every 8 hours as needed for cough.  Take ibuprofen 800 mg--1 tab every 8 hours as needed for pain.   You have been swabbed for COVID, and the test will result in the next 24 hours. Our staff will call you if positive. If the COVID test is positive, you should quarantine until you are fever free for 24 hours and you are starting to feel better, and then take added precautions for the next 5 days, such as physical distancing/wearing a mask and good hand hygiene/washing.  

## 2022-11-29 NOTE — ED Provider Notes (Signed)
Renaldo Fiddler    CSN: [id number] Arrival date & time: 11/29/22  1503      History   Chief Complaint Chief Complaint  Patient presents with   Cough   Sore Throat    HPI Bradley Hood is a 37 y.o. male.    Cough Sore Throat  Here for cough and sore throat.  Symptoms began on November 15.  He has had some very mild nasal drainage and cough.  The cough is happening a lot at night and keeping him up.  His throat is also pretty sore.  No fever or chills or myalgia.  No shortness of breath.  No allergies to medications.  No chronic illnesses.  He states he did have pneumonia about 1 year ago.    Past Medical History:  Diagnosis Date   Diabetes mellitus without complication [facility])     Patient Active Problem List   Diagnosis Date Noted   Obesity, unspecified 12/03/2018   Uncontrolled type 2 diabetes mellitus 12/03/2018    Past Surgical History:  Procedure Laterality Date   NO PAST SURGERIES         Home Medications    Prior to Admission medications   Medication Sig Start Date End Date Taking? Authorizing Provider  benzonatate (TESSALON) 100 MG capsule Take 1 capsule (100 mg total) by mouth 3 (three) times daily as needed for cough. 11/29/22  Yes Zenia Resides, MD  ibuprofen (ADVIL) 800 MG tablet Take 1 tablet (800 mg total) by mouth every 8 (eight) hours as needed (pain). 11/29/22  Yes Zenia Resides, MD    Family History History reviewed. No pertinent family history.  Social History Social History   Tobacco Use   Smoking status: Never   Smokeless tobacco: Never  Vaping Use   Vaping status: Never Used  Substance Use Topics   Alcohol use: Yes    Comment: twice monthly   Drug use: No     Allergies   Patient has no known allergies.   Review of Systems Review of Systems  Respiratory:  Positive for cough.      Physical Exam Triage Vital Signs ED Triage Vitals  Encounter Vitals Group     BP 11/29/22 1639 121/80     Systolic BP  Percentile --      Diastolic BP Percentile --      Pulse Rate 11/29/22 1639 82     Resp 11/29/22 1639 20     Temp 11/29/22 1639 99.5 F (37.5 C)     Temp Source 11/29/22 1639 Oral     SpO2 11/29/22 1639 95 %     Weight 11/29/22 1637 296 lb (134.3 kg)     Height 11/29/22 1637 5\' 7"  (1.702 m)     Head Circumference --      Peak Flow --      Pain Score 11/29/22 1637 8     Pain Loc --      Pain Education --      Exclude from Growth Chart --    No data found.  Updated Vital Signs BP 121/80 (BP Location: Right Arm)   Pulse 82   Temp 99.5 F (37.5 C) (Oral)   Resp 20   Ht 5\' 7"  (1.702 m)   Wt 134.3 kg   SpO2 95%   BMI 46.36 kg/m   Visual Acuity Right Eye Distance:   Left Eye Distance:   Bilateral Distance:    Right Eye Near:   Left Eye Near:  Bilateral Near:     Physical Exam Vitals reviewed.  Constitutional:      General: He is not in acute distress.    Appearance: He is not ill-appearing, toxic-appearing or diaphoretic.  HENT:     Right Ear: Tympanic membrane and ear canal normal.     Left Ear: Tympanic membrane and ear canal normal.     Nose: Congestion present.     Mouth/Throat:     Mouth: Mucous membranes are moist.     Comments: There is some mild erythema and a lot of clear exudate in the oropharynx.  No asymmetry and uvula is in the midline and Eyes:     Extraocular Movements: Extraocular movements intact.     Conjunctiva/sclera: Conjunctivae normal.     Pupils: Pupils are equal, round, and reactive to light.  Cardiovascular:     Rate and Rhythm: Normal rate and regular rhythm.     Heart sounds: No murmur heard. Pulmonary:     Effort: Pulmonary effort is normal. No respiratory distress.     Breath sounds: Normal breath sounds. No stridor. No wheezing, rhonchi or rales.  Musculoskeletal:     Cervical back: Neck supple.  Lymphadenopathy:     Cervical: No cervical adenopathy.  Skin:    Capillary Refill: Capillary refill takes less than 2 seconds.      Coloration: Skin is not jaundiced or pale.  Neurological:     General: No focal deficit present.     Mental Status: He is alert and oriented to person, place, and time.  Psychiatric:        Behavior: Behavior normal.      UC Treatments / Results  Labs (all labs ordered are listed, but only abnormal results are displayed) Labs Reviewed  CULTURE, GROUP A STREP ([facility])  SARS CORONAVIRUS 2 (TAT 6-24 HRS)  POCT RAPID STREP A (OFFICE)    EKG   Radiology No results found.  Procedures Procedures (including critical care time)  Medications Ordered in UC Medications - No data to display  Initial Impression / Assessment and Plan / UC Course  I have reviewed the triage vital signs and the nursing notes.  Pertinent labs & imaging results that were available during my care of the patient were reviewed by me and considered in my medical decision making (see chart for details).     Rapid strep is negative.  Throat culture sent and we will notify him and treat per protocol if that is in turn positive.  Tessalon Perles are sent in for the cough and ibuprofen prescription strength is sent in for throat pain.  Work note is provided  COVID swab is done and if positive we will notify him so he will know he needs to isolate. Final Clinical Impressions(s) / UC Diagnoses   Final diagnoses:  Throat pain  Viral URI     Discharge Instructions      Your strep test is negative.  Culture of the throat will be sent, and staff will notify you if that is in turn positive.  Take benzonatate 100 mg, 1 tab every 8 hours as needed for cough.  Take ibuprofen 800 mg--1 tab every 8 hours as needed for pain.   You have been swabbed for COVID, and the test will result in the next 24 hours. Our staff will call you if positive. If the COVID test is positive, you should quarantine until you are fever free for 24 hours and you are starting to feel better, and  then take added precautions for the next  5 days, such as physical distancing/wearing a mask and good hand hygiene/washing.       ED Prescriptions     Medication Sig Dispense Auth. Provider   benzonatate (TESSALON) 100 MG capsule Take 1 capsule (100 mg total) by mouth 3 (three) times daily as needed for cough. 21 capsule Zenia Resides, MD   ibuprofen (ADVIL) 800 MG tablet Take 1 tablet (800 mg total) by mouth every 8 (eight) hours as needed (pain). 21 tablet Letizia Hook, Janace Aris, MD      PDMP not reviewed this encounter.   Zenia Resides, MD 11/29/22 [phone]

## 2022-11-29 NOTE — ED Triage Notes (Signed)
Patient states cough and sore throat x3 days, taking OTC cough drops only, hot tea

## 2022-11-30 LAB — SARS CORONAVIRUS 2 (TAT 6-24 HRS): SARS Coronavirus 2: NEGATIVE

## 2022-12-02 LAB — CULTURE, GROUP A STREP (THRC)

## 2022-12-14 ENCOUNTER — Encounter (HOSPITAL_COMMUNITY): Payer: Self-pay | Admitting: *Deleted

## 2023-04-08 ENCOUNTER — Other Ambulatory Visit: Payer: Self-pay

## 2023-04-08 ENCOUNTER — Ambulatory Visit
Admission: RE | Admit: 2023-04-08 | Discharge: 2023-04-08 | Disposition: A | Discharge status: Home / Self Care | Payer: Self-pay | Source: Ambulatory Visit

## 2023-04-08 VITALS — BP 105/72 | HR 71 | Temp 98.1°F | Resp 18

## 2023-04-08 DIAGNOSIS — A084 Viral intestinal infection, unspecified: Secondary | ICD-10-CM

## 2023-04-08 MED ORDER — LOPERAMIDE HCL 2 MG PO CAPS: 2.0000 mg | ORAL_CAPSULE | Freq: Four times a day (QID) | ORAL | 0 refills | Status: AC | PRN

## 2023-04-08 MED ORDER — ONDANSETRON 4 MG PO TBDP: 4.0000 mg | ORAL_TABLET | Freq: Three times a day (TID) | ORAL | 0 refills | Status: AC | PRN

## 2023-04-08 NOTE — ED Triage Notes (Signed)
 Patient presents to [facility] for evaluation of nausea, emesis x 2, and diarrhea x 6 since yesterday.  Denies fever or chills.  Has been trying pepto, tylenol and liquid IV.  Abdominal pain yesterday, but better today

## 2023-04-08 NOTE — ED Provider Notes (Signed)
 Bradley Hood    CSN: [id number] Arrival date & time: 04/08/23  0931      History   Chief Complaint Chief Complaint  Patient presents with   Nausea    Head aches and diarrhea - Entered by patient   Diarrhea         HPI Bradley Hood is a 38 y.o. male.   Patient presents for evaluation of intermittent centralized abdominal pain, nausea with vomiting and diarrhea present for 2 days.  Last occurrence of vomiting and diarrhea yesterday evening.  Diarrhea described as watery.  Only able to tolerate minimal amount of food and liquids.  Has attempted Tylenol and increase electrolyte consumption.  Possible sick contact with similar symptoms.  Has fever or URI symptoms.  Past Medical History:  Diagnosis Date   Diabetes mellitus without complication [facility])     Patient Active Problem List   Diagnosis Date Noted   Obesity, unspecified 12/03/2018   Uncontrolled type 2 diabetes mellitus 12/03/2018    Past Surgical History:  Procedure Laterality Date   NO PAST SURGERIES         Home Medications    Prior to Admission medications   Medication Sig Start Date End Date Taking? Authorizing Provider  loperamide (IMODIUM) 2 MG capsule Take 1 capsule (2 mg total) by mouth 4 (four) times daily as needed for diarrhea or loose stools. 04/08/23  Yes Ingri Diemer, Elita Boone, NP  metFORMIN (GLUCOPHAGE) 500 MG tablet Take by mouth 2 (two) times daily with a meal.   Yes [provider]  ondansetron (ZOFRAN-ODT) 4 MG disintegrating tablet Take 1 tablet (4 mg total) by mouth every 8 (eight) hours as needed. 04/08/23  Yes Karigan Cloninger R, NP  benzonatate (TESSALON) 100 MG capsule Take 1 capsule (100 mg total) by mouth 3 (three) times daily as needed for cough. 11/29/22   Zenia Resides, MD  ibuprofen (ADVIL) 800 MG tablet Take 1 tablet (800 mg total) by mouth every 8 (eight) hours as needed (pain). 11/29/22   Zenia Resides, MD    Family History History reviewed. No pertinent  family history.  Social History Social History   Tobacco Use   Smoking status: Never   Smokeless tobacco: Never  Vaping Use   Vaping status: Never Used  Substance Use Topics   Alcohol use: Yes    Comment: twice monthly   Drug use: No     Allergies   Patient has no known allergies.   Review of Systems Review of Systems   Physical Exam Triage Vital Signs ED Triage Vitals  Encounter Vitals Group     BP 04/08/23 0947 105/72     Systolic BP Percentile --      Diastolic BP Percentile --      Pulse Rate 04/08/23 0947 71     Resp 04/08/23 0947 18     Temp 04/08/23 0947 98.1 F (36.7 C)     Temp src --      SpO2 04/08/23 0947 95 %     Weight --      Height --      Head Circumference --      Peak Flow --      Pain Score 04/08/23 0954 3     Pain Loc --      Pain Education --      Exclude from Growth Chart --    No data found.  Updated Vital Signs BP 105/72 (BP Location: Right Arm)   Pulse  71   Temp 98.1 F (36.7 C)   Resp 18   SpO2 95%   Visual Acuity Right Eye Distance:   Left Eye Distance:   Bilateral Distance:    Right Eye Near:   Left Eye Near:    Bilateral Near:     Physical Exam Constitutional:      Appearance: Normal appearance.  Eyes:     Extraocular Movements: Extraocular movements intact.  Pulmonary:     Effort: Pulmonary effort is normal.  Abdominal:     General: Abdomen is flat. Bowel sounds are normal.     Palpations: Abdomen is soft.     Tenderness: There is abdominal tenderness in the epigastric area.  Neurological:     Mental Status: He is alert and oriented to person, place, and time. Mental status is at baseline.      UC Treatments / Results  Labs (all labs ordered are listed, but only abnormal results are displayed) Labs Reviewed  POC COVID19/FLU A&B COMBO    EKG   Radiology No results found.  Procedures Procedures (including critical care time)  Medications Ordered in UC Medications - No data to  display  Initial Impression / Assessment and Plan / UC Course  I have reviewed the triage vital signs and the nursing notes.  Pertinent labs & imaging results that were available during my care of the patient were reviewed by me and considered in my medical decision making (see chart for details).  Viral Gastroenteritis  Vitals are stable, patient in no signs of distress or toxic appearing, tenderness noted to the epigastric region, nonguarding, sick contact, etiology most likely viral, prescribe Zofran and Imodium recommend increase fluid intake with food as tolerated, advised follow-up as needed Final Clinical Impressions(s) / UC Diagnoses   Final diagnoses:  Viral gastroenteritis     Discharge Instructions      Your symptoms are most likely caused by a virus, it will work its way out your system over the next few days  You can use zofran every 8 hours as needed for nausea, be mindful this medication may make you drowsy, take the first dose at home to see how it affects your body  You can use Imodium to help with diarrhea, and be mindful over use of this medication may cause opposite effect constipation  You can use over-the-counter ibuprofen or Tylenol, which ever you have at home, to help manage fevers  Continue to promote hydration throughout the day by using electrolyte replacement solution such as Gatorade, body armor, Pedialyte, which ever you have at home  Try eating bland foods such as bread, rice, toast, fruit which are easier on the stomach to digest, avoid foods that are overly spicy, overly seasoned or greasy    ED Prescriptions     Medication Sig Dispense Auth. Provider   ondansetron (ZOFRAN-ODT) 4 MG disintegrating tablet Take 1 tablet (4 mg total) by mouth every 8 (eight) hours as needed. 20 tablet Francisco Eyerly, Hansel Starling R, NP   loperamide (IMODIUM) 2 MG capsule Take 1 capsule (2 mg total) by mouth 4 (four) times daily as needed for diarrhea or loose stools. 12 capsule  Valinda Hoar, NP      PDMP not reviewed this encounter.   Valinda Hoar, NP 04/08/23 1004

## 2023-04-08 NOTE — Discharge Instructions (Signed)
Your symptoms are most likely caused by a virus, it will work its way out your system over the next few days ? ?You can use zofran every 8 hours as needed for nausea, be mindful this medication may make you drowsy, take the first dose at home to see how it affects your body ? ?You can use Imodium to help with diarrhea, and be mindful over use of this medication may cause opposite effect constipation ? ?You can use over-the-counter ibuprofen or Tylenol, which ever you have at home, to help manage fevers ? ?Continue to promote hydration throughout the day by using electrolyte replacement solution such as Gatorade, body armor, Pedialyte, which ever you have at home ? ?Try eating bland foods such as bread, rice, toast, fruit which are easier on the stomach to digest, avoid foods that are overly spicy, overly seasoned or greasy  ?

## 2023-04-28 ENCOUNTER — Other Ambulatory Visit: Payer: Self-pay

## 2023-04-28 ENCOUNTER — Ambulatory Visit
Admission: RE | Admit: 2023-04-28 | Discharge: 2023-04-28 | Disposition: A | Discharge status: Home / Self Care | Payer: Self-pay | Source: Ambulatory Visit | Attending: Family Medicine

## 2023-04-28 VITALS — BP 141/91 | HR 66 | Temp 97.7°F | Resp 18

## 2023-04-28 DIAGNOSIS — K047 Periapical abscess without sinus: Secondary | ICD-10-CM

## 2023-04-28 DIAGNOSIS — R220 Localized swelling, mass and lump, head: Secondary | ICD-10-CM

## 2023-04-28 MED ORDER — AMOXICILLIN-POT CLAVULANATE 875-125 MG PO TABS: 1.0000 | ORAL_TABLET | Freq: Two times a day (BID) | ORAL | 0 refills | Status: AC

## 2023-04-28 MED ORDER — CHLORHEXIDINE GLUCONATE 0.12 % MT SOLN: 15.0000 mL | Freq: Two times a day (BID) | OROMUCOSAL | 0 refills | Status: DC

## 2023-04-28 NOTE — Discharge Instructions (Addendum)
 Plea entire course of antibiotics.  Rinse with Peridex twice daily for the next 4 to 5 days.  Continue Excedrin or Naprosyn as needed for tooth pain.  Will work with one of the dental providers listed  for an extraction of tooth.

## 2023-04-28 NOTE — ED Provider Notes (Signed)
 Bradley Hood    CSN: [id number] Arrival date & time: 04/28/23  0803      History   Chief Complaint Chief Complaint  Patient presents with   Headache    Headache all the way to the jaw - Entered by patient   Dental Pain    HPI Bradley Hood is a 38 y.o. male.   Patient here with right sided facial and tooth pain related to a dental infection. The patient reports he has a broken molar tooth and does not have a dentist at resident and needs to have tooth extracted.  He reports right side of his face is swollen and painful.  Denies any fever.  Excedrin for pain as needed with minimal relief.  Past Medical History:  Diagnosis Date   Diabetes mellitus without complication [facility])     Patient Active Problem List   Diagnosis Date Noted   Obesity, unspecified 12/03/2018   Uncontrolled type 2 diabetes mellitus 12/03/2018    Past Surgical History:  Procedure Laterality Date   NO PAST SURGERIES         Home Medications    Prior to Admission medications   Medication Sig Start Date End Date Taking? Authorizing Provider  amoxicillin-clavulanate (AUGMENTIN) 875-125 MG tablet Take 1 tablet by mouth every 12 (twelve) hours for 10 days. 04/28/23 05/08/23 Yes Bing Neighbors, NP  chlorhexidine (PERIDEX) 0.12 % solution Use as directed 15 mLs in the mouth or throat 2 (two) times daily. 04/28/23  Yes Bing Neighbors, NP  benzonatate (TESSALON) 100 MG capsule Take 1 capsule (100 mg total) by mouth 3 (three) times daily as needed for cough. 11/29/22   Zenia Resides, MD  ibuprofen (ADVIL) 800 MG tablet Take 1 tablet (800 mg total) by mouth every 8 (eight) hours as needed (pain). 11/29/22   Zenia Resides, MD  loperamide (IMODIUM) 2 MG capsule Take 1 capsule (2 mg total) by mouth 4 (four) times daily as needed for diarrhea or loose stools. 04/08/23   Valinda Hoar, NP  metFORMIN (GLUCOPHAGE) 500 MG tablet Take by mouth 2 (two) times daily with a meal.    [provider]  ondansetron (ZOFRAN-ODT) 4 MG disintegrating tablet Take 1 tablet (4 mg total) by mouth every 8 (eight) hours as needed. 04/08/23   Valinda Hoar, NP    Family History History reviewed. No pertinent family history.  Social History Social History   Tobacco Use   Smoking status: Never   Smokeless tobacco: Never  Vaping Use   Vaping status: Never Used  Substance Use Topics   Alcohol use: Yes    Comment: twice monthly   Drug use: No     Allergies   Patient has no known allergies.   Review of Systems Review of Systems  HENT:  Positive for dental problem.   Neurological:  Positive for headaches.     Physical Exam Triage Vital Signs ED Triage Vitals [04/28/23 0812]  Encounter Vitals Group     BP (!) 141/91     Systolic BP Percentile      Diastolic BP Percentile      Pulse Rate 66     Resp 18     Temp 97.7 F (36.5 C)     Temp Source Oral     SpO2 96 %     Weight      Height      Head Circumference      Peak Flow  Pain Score 8     Pain Loc      Pain Education      Exclude from Growth Chart    No data found.  Updated Vital Signs BP (!) 141/91 (BP Location: Left Arm)   Pulse 66   Temp 97.7 F (36.5 C) (Oral)   Resp 18   SpO2 96%   Visual Acuity Right Eye Distance:   Left Eye Distance:   Bilateral Distance:    Right Eye Near:   Left Eye Near:    Bilateral Near:     Physical Exam Vitals reviewed.  Constitutional:      Appearance: He is well-developed.  HENT:     Head: Normocephalic and atraumatic.     Comments: Right facial swelling     Mouth/Throat:     Mouth: Mucous membranes are dry.     Dentition: Abnormal dentition. Dental tenderness and dental caries present.  Eyes:     Extraocular Movements: Extraocular movements intact.     Pupils: Pupils are equal, round, and reactive to light.  Cardiovascular:     Rate and Rhythm: Normal rate and regular rhythm.  Pulmonary:     Effort: Pulmonary effort is normal.      Breath sounds: Normal breath sounds.  Musculoskeletal:     Cervical back: Normal range of motion and neck supple.  Skin:    General: Skin is warm and dry.  Neurological:     General: No focal deficit present.     Mental Status: He is alert and oriented to person, place, and time.     GCS: GCS eye subscore is 4. GCS verbal subscore is 5. GCS motor subscore is 6.      UC Treatments / Results  Labs (all labs ordered are listed, but only abnormal results are displayed) Labs Reviewed - No data to display  EKG   Radiology No results found.  Procedures Procedures (including critical care time)  Medications Ordered in UC Medications - No data to display  Initial Impression / Assessment and Plan / UC Course  I have reviewed the triage vital signs and the nursing notes.  Pertinent labs & imaging results that were available during my care of the patient were reviewed by me and considered in my medical decision making (see chart for details).    Dental infection with right facial swelling, treatment with Augmentin twice daily for 10 days. Peridex 15 mL twice daily for 4 to 5 days as needed for gingival infection.  Follow-up with one of the dental providers listed on her discharge paperwork.  Return precautions given if facial swelling or symptoms worsen. Final Clinical Impressions(s) / UC Diagnoses   Final diagnoses:  Dental infection  Right facial swelling     Discharge Instructions      Plea entire course of antibiotics.  Rinse with Peridex twice daily for the next 4 to 5 days.  Continue Excedrin or Naprosyn as needed for tooth pain.  Will work with one of the dental providers listed [place] for an extraction of tooth.     ED Prescriptions     Medication Sig Dispense Auth. Provider   amoxicillin-clavulanate (AUGMENTIN) 875-125 MG tablet Take 1 tablet by mouth every 12 (twelve) hours for 10 days. 20 tablet Buena Carmine, NP   chlorhexidine (PERIDEX) 0.12 % solution  Use as directed 15 mLs in the mouth or throat 2 (two) times daily. 120 mL Buena Carmine, NP      PDMP not reviewed  this encounter.   Buena Carmine, NP 04/28/23 [phone]

## 2023-04-28 NOTE — ED Triage Notes (Signed)
 Patient presents to [facility] for evaluation of right sided dental pain starting Monday as a dull ache, but worsening yesterday into sharp pain radiating into his head and down into his jaw.  Has a broken tooth on the right side that needs attention

## 2023-06-18 ENCOUNTER — Ambulatory Visit
Admission: EM | Admit: 2023-06-18 | Discharge: 2023-06-18 | Disposition: A | Discharge status: Home / Self Care | Payer: Self-pay | Attending: Emergency Medicine | Admitting: Emergency Medicine

## 2023-06-18 DIAGNOSIS — K0889 Other specified disorders of teeth and supporting structures: Secondary | ICD-10-CM

## 2023-06-18 DIAGNOSIS — R220 Localized swelling, mass and lump, head: Secondary | ICD-10-CM

## 2023-06-18 MED ORDER — AMOXICILLIN-POT CLAVULANATE 875-125 MG PO TABS: 1.0000 | ORAL_TABLET | Freq: Two times a day (BID) | ORAL | 0 refills | Status: DC

## 2023-06-18 MED ORDER — ACETAMINOPHEN-CODEINE 300-30 MG PO TABS
1.0000 | ORAL_TABLET | Freq: Four times a day (QID) | ORAL | 0 refills | Status: DC | PRN
Start: 2023-06-18 — End: 2023-06-21

## 2023-06-18 NOTE — ED Provider Notes (Signed)
 Bradley Hood    CSN: [id number] Arrival date & time: 06/18/23  1329      History   Chief Complaint Chief Complaint  Patient presents with   Dental Pain    HPI Bradley Hood is a 38 y.o. male.   Patient presents for evaluation of pain and swelling to the right side of the face beginning 1 day ago.  Difficulty chewing but able to tolerate food and liquids.  Denies fever or drainage.  He is awaiting this he did tilts but insurance has not started.  Past Medical History:  Diagnosis Date   Diabetes mellitus without complication [facility])     Patient Active Problem List   Diagnosis Date Noted   Obesity, unspecified 12/03/2018   Uncontrolled type 2 diabetes mellitus 12/03/2018    Past Surgical History:  Procedure Laterality Date   NO PAST SURGERIES         Home Medications    Prior to Admission medications   Medication Sig Start Date End Date Taking? Authorizing Provider  acetaminophen -codeine (TYLENOL  #3) 300-30 MG tablet Take 1 tablet by mouth every 6 (six) hours as needed for moderate pain (pain score 4-6). 06/18/23  Yes Abigael Mogle, Maybelle Spatz, NP  amoxicillin -clavulanate (AUGMENTIN ) 875-125 MG tablet Take 1 tablet by mouth every 12 (twelve) hours. 06/18/23  Yes Eran Mistry R, NP  benzonatate  (TESSALON ) 100 MG capsule Take 1 capsule (100 mg total) by mouth 3 (three) times daily as needed for cough. 11/29/22   Ann Keto, MD  chlorhexidine  (PERIDEX ) 0.12 % solution Use as directed 15 mLs in the mouth or throat 2 (two) times daily. 04/28/23   Buena Carmine, NP  ibuprofen  (ADVIL ) 800 MG tablet Take 1 tablet (800 mg total) by mouth every 8 (eight) hours as needed (pain). 11/29/22   Ann Keto, MD  loperamide  (IMODIUM ) 2 MG capsule Take 1 capsule (2 mg total) by mouth 4 (four) times daily as needed for diarrhea or loose stools. 04/08/23   Reena Canning, NP  metFORMIN (GLUCOPHAGE) 500 MG tablet Take by mouth 2 (two) times daily with a meal.    [provider]  ondansetron  (ZOFRAN -ODT) 4 MG disintegrating tablet Take 1 tablet (4 mg total) by mouth every 8 (eight) hours as needed. 04/08/23   Reena Canning, NP    Family History History reviewed. No pertinent family history.  Social History Social History   Tobacco Use   Smoking status: Never   Smokeless tobacco: Never  Vaping Use   Vaping status: Never Used  Substance Use Topics   Alcohol use: Yes    Comment: twice monthly   Drug use: No     Allergies   Patient has no known allergies.   Review of Systems Review of Systems   Physical Exam Triage Vital Signs ED Triage Vitals  Encounter Vitals Group     BP 06/18/23 1336 123/78     Systolic BP Percentile --      Diastolic BP Percentile --      Pulse Rate 06/18/23 1336 78     Resp 06/18/23 1336 16     Temp 06/18/23 1336 97.8 F (36.6 C)     Temp Source 06/18/23 1336 Temporal     SpO2 06/18/23 1336 97 %     Weight --      Height --      Head Circumference --      Peak Flow --      Pain Score 06/18/23  1338 8     Pain Loc --      Pain Education --      Exclude from Growth Chart --    No data found.  Updated Vital Signs BP 123/78 (BP Location: Left Arm)   Pulse 78   Temp 97.8 F (36.6 C) (Temporal)   Resp 16   SpO2 97%   Visual Acuity Right Eye Distance:   Left Eye Distance:   Bilateral Distance:    Right Eye Near:   Left Eye Near:    Bilateral Near:     Physical Exam Constitutional:      Appearance: Normal appearance.  HENT:     Mouth/Throat:     Comments: Moderate swelling to the right cheek, dental decay along the right lower gumline with moderate gingival swelling, no abscess noted, pharynx clear without obstruction Eyes:     Extraocular Movements: Extraocular movements intact.  Pulmonary:     Effort: Pulmonary effort is normal.  Neurological:     Mental Status: He is alert and oriented to person, place, and time.      UC Treatments / Results  Labs (all labs ordered are  listed, but only abnormal results are displayed) Labs Reviewed - No data to display  EKG   Radiology No results found.  Procedures Procedures (including critical care time)  Medications Ordered in UC Medications - No data to display  Initial Impression / Assessment and Plan / UC Course  I have reviewed the triage vital signs and the nursing notes.  Pertinent labs & imaging results that were available during my care of the patient were reviewed by me and considered in my medical decision making (see chart for details).  Dental pain, facial swelling  Presentation concerning for infection, empirically placed on Augmentin , for management of pain prescribed Tylenol  3, PDMP reviewed, low risk, advised follow-up as needed, recommended additional supportive care with increase fluid intake Final Clinical Impressions(s) / UC Diagnoses   Final diagnoses:  Pain, dental  Facial swelling   Discharge Instructions      Your evaluated for your dental pain  Take Augmentin  twice daily for 7 days to get rid of any Derm contributing to symptoms  You may take ibuprofen , Excedrin for your pain management, may attempt use of Tylenol  3 which has codeine in it every 6 hours for severe pain, be mindful this has codeine in it and will make you drowsy  May attempt use of salt water gargles, throat lozenges warm liquids and soft foods for comfort, increase your fluid intake until you are able to eat as normal  As soon as your dental insurance kicks in and please schedule follow-up appointment for further management  ED Prescriptions     Medication Sig Dispense Auth. Provider   amoxicillin -clavulanate (AUGMENTIN ) 875-125 MG tablet Take 1 tablet by mouth every 12 (twelve) hours. 14 tablet Semaj Coburn R, NP   acetaminophen -codeine (TYLENOL  #3) 300-30 MG tablet Take 1 tablet by mouth every 6 (six) hours as needed for moderate pain (pain score 4-6). 10 tablet Jalissa Heinzelman R, NP      I have  reviewed the PDMP during this encounter.   Reena Canning, NP 06/18/23 1346

## 2023-06-18 NOTE — Discharge Instructions (Signed)
 Your evaluated for your dental pain  Take Augmentin  twice daily for 7 days to get rid of any Derm contributing to symptoms  You may take ibuprofen , Excedrin for your pain management, may attempt use of Tylenol  3 which has codeine in it every 6 hours for severe pain, be mindful this has codeine in it and will make you drowsy  May attempt use of salt water gargles, throat lozenges warm liquids and soft foods for comfort, increase your fluid intake until you are able to eat as normal  As soon as your dental insurance kicks in and please schedule follow-up appointment for further management

## 2023-06-18 NOTE — ED Triage Notes (Signed)
 Patient presents to [facility] for right side dental pain x 1 day. Took Excedrin 30 mins ago.

## 2023-06-19 ENCOUNTER — Emergency Department: Payer: Self-pay

## 2023-06-19 ENCOUNTER — Encounter: Payer: Self-pay | Admitting: Emergency Medicine

## 2023-06-19 ENCOUNTER — Other Ambulatory Visit: Payer: Self-pay

## 2023-06-19 ENCOUNTER — Inpatient Hospital Stay
Admission: EM | Admit: 2023-06-19 | Discharge: 2023-06-21 | DRG: 158 | Disposition: A | Discharge status: Home / Self Care | Payer: Self-pay | Attending: Student | Admitting: Student

## 2023-06-19 ENCOUNTER — Ambulatory Visit: Payer: Self-pay

## 2023-06-19 DIAGNOSIS — E1165 Type 2 diabetes mellitus with hyperglycemia: Secondary | ICD-10-CM

## 2023-06-19 DIAGNOSIS — Z6841 Body Mass Index (BMI) 40.0 and over, adult: Secondary | ICD-10-CM

## 2023-06-19 DIAGNOSIS — E559 Vitamin D deficiency, unspecified: Secondary | ICD-10-CM | POA: Diagnosis present

## 2023-06-19 DIAGNOSIS — L03211 Cellulitis of face: Principal | ICD-10-CM | POA: Diagnosis present

## 2023-06-19 DIAGNOSIS — Z7984 Long term (current) use of oral hypoglycemic drugs: Secondary | ICD-10-CM

## 2023-06-19 DIAGNOSIS — K029 Dental caries, unspecified: Secondary | ICD-10-CM | POA: Diagnosis present

## 2023-06-19 DIAGNOSIS — E66813 Obesity, class 3: Secondary | ICD-10-CM | POA: Insufficient documentation

## 2023-06-19 DIAGNOSIS — K047 Periapical abscess without sinus: Principal | ICD-10-CM

## 2023-06-19 DIAGNOSIS — Z79899 Other long term (current) drug therapy: Secondary | ICD-10-CM

## 2023-06-19 LAB — COMPREHENSIVE METABOLIC PANEL WITH GFR
ALT: 29 U/L (ref 0–44)
AST: 18 U/L (ref 15–41)
Albumin: 3.5 g/dL (ref 3.5–5.0)
Alkaline Phosphatase: 55 U/L (ref 38–126)
Anion gap: 10 (ref 5–15)
BUN: 15 mg/dL (ref 6–20)
CO2: 24 mmol/L (ref 22–32)
Calcium: 8.5 mg/dL — ABNORMAL LOW (ref 8.9–10.3)
Chloride: 98 mmol/L (ref 98–111)
Creatinine, Ser: 0.79 mg/dL (ref 0.61–1.24)
GFR, Estimated: >60 mL/min (ref >60)
Glucose, Bld: 254 mg/dL — ABNORMAL HIGH (ref 70–99)
Potassium: 3.8 mmol/L (ref 3.5–5.1)
Sodium: 132 mmol/L — ABNORMAL LOW (ref 135–145)
Total Bilirubin: 0.9 mg/dL (ref 0.0–1.2)
Total Protein: 7.3 g/dL (ref 6.5–8.1)

## 2023-06-19 LAB — CBC WITH DIFFERENTIAL/PLATELET
Abs Immature Granulocytes: 0.04 10*3/uL (ref 0.00–0.07)
Basophils Absolute: 0.1 10*3/uL (ref 0.0–0.1)
Basophils Relative: 1 %
Eosinophils Absolute: 0.3 10*3/uL (ref 0.0–0.5)
Eosinophils Relative: 3 %
HCT: 48.1 % (ref 39.0–52.0)
Hemoglobin: 16.4 g/dL (ref 13.0–17.0)
Immature Granulocytes: 0 %
Lymphocytes Relative: 18 %
Lymphs Abs: 1.8 10*3/uL (ref 0.7–4.0)
MCH: 29.2 pg (ref 26.0–34.0)
MCHC: 34.1 g/dL (ref 30.0–36.0)
MCV: 85.7 fL (ref 80.0–100.0)
Monocytes Absolute: 0.7 10*3/uL (ref 0.1–1.0)
Monocytes Relative: 7 %
Neutro Abs: 7.3 10*3/uL (ref 1.7–7.7)
Neutrophils Relative %: 71 %
Platelets: 184 10*3/uL (ref 150–400)
RBC: 5.61 MIL/uL (ref 4.22–5.81)
RDW: 12.6 % (ref 11.5–15.5)
WBC: 10.2 10*3/uL (ref 4.0–10.5)
nRBC: 0 % (ref 0.0–0.2)

## 2023-06-19 LAB — GLUCOSE, CAPILLARY
Glucose-Capillary: 201 mg/dL — ABNORMAL HIGH (ref 70–99)
Glucose-Capillary: 197 mg/dL — ABNORMAL HIGH (ref 70–99)
Glucose-Capillary: 195 mg/dL — ABNORMAL HIGH (ref 70–99)
Glucose-Capillary: 256 mg/dL — ABNORMAL HIGH (ref 70–99)

## 2023-06-19 LAB — CBG MONITORING, ED: Glucose-Capillary: 258 mg/dL — ABNORMAL HIGH (ref 70–99)

## 2023-06-19 MED ORDER — ONDANSETRON HCL 4 MG/2ML IJ SOLN
4.0000 mg | Freq: Once | INTRAMUSCULAR | Status: AC
  Administered 2023-06-19: 4 mg via INTRAVENOUS
  Filled 2023-06-19: qty 2

## 2023-06-19 MED ORDER — SODIUM CHLORIDE 0.9 % IV SOLN
3.0000 g | Freq: Once | INTRAVENOUS | Status: AC
  Administered 2023-06-19: 3 g via INTRAVENOUS
  Filled 2023-06-19: qty 8

## 2023-06-19 MED ORDER — KETOROLAC TROMETHAMINE 30 MG/ML IJ SOLN
30.0000 mg | Freq: Once | INTRAMUSCULAR | Status: AC
  Administered 2023-06-19: 30 mg via INTRAVENOUS
  Filled 2023-06-19: qty 1

## 2023-06-19 MED ORDER — ACETAMINOPHEN 650 MG RE SUPP: 650.0000 mg | Freq: Four times a day (QID) | RECTAL | Status: DC | PRN

## 2023-06-19 MED ORDER — SODIUM CHLORIDE 0.9 % IV BOLUS (SEPSIS)
1000.0000 mL | Freq: Once | INTRAVENOUS | Status: AC
Start: 2023-06-19 — End: 2023-06-19
  Administered 2023-06-19: 1000 mL via INTRAVENOUS

## 2023-06-19 MED ORDER — HYDROCODONE-ACETAMINOPHEN 5-325 MG PO TABS
1.0000 | ORAL_TABLET | ORAL | Status: DC | PRN
  Administered 2023-06-19: 1 via ORAL
  Filled 2023-06-19: qty 1

## 2023-06-19 MED ORDER — INSULIN ASPART 100 UNIT/ML IJ SOLN
0.0000 [IU] | Freq: Every day | INTRAMUSCULAR | Status: DC
  Administered 2023-06-19: 3 [IU] via SUBCUTANEOUS
  Administered 2023-06-20: 2 [IU] via SUBCUTANEOUS
  Filled 2023-06-19 (×2): qty 1

## 2023-06-19 MED ORDER — SODIUM CHLORIDE 0.9 % IV SOLN
3.0000 g | Freq: Four times a day (QID) | INTRAVENOUS | Status: DC
  Administered 2023-06-19 – 2023-06-21 (×9): 3 g via INTRAVENOUS
  Filled 2023-06-19 (×10): qty 8

## 2023-06-19 MED ORDER — MORPHINE SULFATE (PF) 4 MG/ML IV SOLN
4.0000 mg | Freq: Once | INTRAVENOUS | Status: AC
  Administered 2023-06-19: 4 mg via INTRAVENOUS
  Filled 2023-06-19: qty 1

## 2023-06-19 MED ORDER — ONDANSETRON HCL 4 MG/2ML IJ SOLN: 4.0000 mg | Freq: Four times a day (QID) | INTRAMUSCULAR | Status: DC | PRN

## 2023-06-19 MED ORDER — IOHEXOL 300 MG/ML  SOLN
75.0000 mL | Freq: Once | INTRAMUSCULAR | Status: AC | PRN
  Administered 2023-06-19: 75 mL via INTRAVENOUS

## 2023-06-19 MED ORDER — INSULIN ASPART 100 UNIT/ML IJ SOLN
0.0000 [IU] | Freq: Three times a day (TID) | INTRAMUSCULAR | Status: DC
  Administered 2023-06-19: 4 [IU] via SUBCUTANEOUS
  Administered 2023-06-19: 7 [IU] via SUBCUTANEOUS
  Administered 2023-06-19: 4 [IU] via SUBCUTANEOUS
  Administered 2023-06-20: 3 [IU] via SUBCUTANEOUS
  Administered 2023-06-20: 7 [IU] via SUBCUTANEOUS
  Administered 2023-06-20: 4 [IU] via SUBCUTANEOUS
  Administered 2023-06-21: 7 [IU] via SUBCUTANEOUS
  Administered 2023-06-21: 4 [IU] via SUBCUTANEOUS
  Filled 2023-06-19 (×8): qty 1

## 2023-06-19 MED ORDER — KETOROLAC TROMETHAMINE 30 MG/ML IJ SOLN
30.0000 mg | Freq: Four times a day (QID) | INTRAMUSCULAR | Status: DC | PRN
Start: 2023-06-19 — End: 2023-06-24

## 2023-06-19 MED ORDER — ONDANSETRON HCL 4 MG PO TABS: 4.0000 mg | ORAL_TABLET | Freq: Four times a day (QID) | ORAL | Status: DC | PRN

## 2023-06-19 MED ORDER — ACETAMINOPHEN 325 MG PO TABS: 650.0000 mg | ORAL_TABLET | Freq: Four times a day (QID) | ORAL | Status: DC | PRN

## 2023-06-19 NOTE — Assessment & Plan Note (Signed)
 Sliding scale insulin  diet

## 2023-06-19 NOTE — H&P (Signed)
 History and Physical    Patient: Bradley Hood ZYS:[id number] DOB: [birth year] DOA: 06/19/2023 DOS: the patient was seen and examined on 06/19/2023 PCP: Eartha Gold, MD  Patient coming from: Home  Chief Complaint:  Chief Complaint  Patient presents with   Facial Swelling    HPI: Bradley Hood is a 38 y.o. male with medical history significant for DM and morbid obesity being admitted with facial cellulitis secondary to odontogenic/periapical abscess.  Was seen in urgent care the day prior and started on Augmentin  but took just 1 dose but had worsening symptoms.  With worsening facial swelling right mandibular area and having difficulty opening his mouth.  He denies fever or chills. ED course and data review: Vitals unremarkable Labs notable for elevated blood sugar of 254 but normal WBC and otherwise unremarkabl CT.Maxillofacial showing the following: IMPRESSION: 1. Positive for odontogenic Right Face Cellulitis. Regional inflammation superimposed on carious right mandible molar with periapical lucency, dehiscence, and overlying small 12 mm Subperiosteal Abscess. Reactive right level 1 and 2 lymph nodes. No other complicating features.   2. Additional carious right maxillary dentition. Unrelated appearing left maxillary sinus disease. Chronic postinflammatory appearance of the parotid glands.  Patient treated with Unasyn  and given Toradol  for pain and an NS bolus  Hospitalist consulted for admission.     Review of Systems: As mentioned in the history of present illness. All other systems reviewed and are negative.  Past Medical History:  Diagnosis Date   Diabetes mellitus without complication (HCC)    Past Surgical History:  Procedure Laterality Date   NO PAST SURGERIES     Social History:  reports that he has never smoked. He has never used smokeless tobacco. He reports current alcohol use. He reports that he does not use drugs.  No Known Allergies  History reviewed.  No pertinent family history.  Prior to Admission medications   Medication Sig Start Date End Date Taking? Authorizing Provider  acetaminophen -codeine (TYLENOL  #3) 300-30 MG tablet Take 1 tablet by mouth every 6 (six) hours as needed for moderate pain (pain score 4-6). 06/18/23   Reena Canning, NP  amoxicillin -clavulanate (AUGMENTIN ) 875-125 MG tablet Take 1 tablet by mouth every 12 (twelve) hours. 06/18/23   White, Maybelle Spatz, NP  benzonatate  (TESSALON ) 100 MG capsule Take 1 capsule (100 mg total) by mouth 3 (three) times daily as needed for cough. 11/29/22   Ann Keto, MD  chlorhexidine  (PERIDEX ) 0.12 % solution Use as directed 15 mLs in the mouth or throat 2 (two) times daily. 04/28/23   Buena Carmine, NP  ibuprofen  (ADVIL ) 800 MG tablet Take 1 tablet (800 mg total) by mouth every 8 (eight) hours as needed (pain). 11/29/22   Ann Keto, MD  loperamide  (IMODIUM ) 2 MG capsule Take 1 capsule (2 mg total) by mouth 4 (four) times daily as needed for diarrhea or loose stools. 04/08/23   Reena Canning, NP  metFORMIN (GLUCOPHAGE) 500 MG tablet Take by mouth 2 (two) times daily with a meal.    [provider]  ondansetron  (ZOFRAN -ODT) 4 MG disintegrating tablet Take 1 tablet (4 mg total) by mouth every 8 (eight) hours as needed. 04/08/23   Reena Canning, NP    Physical Exam: Vitals:   06/19/23 0216 06/19/23 0217 06/19/23 0328  BP: (!) 147/82  116/68  Pulse: 85  87  Resp: 19  19  Temp: 98.7 F (37.1 C)  98.7 F (37.1 C)  TempSrc: Oral  Oral  SpO2:  96%  92%  Weight:  136.1 kg   Height:  5\' 7"  (1.702 m)    Physical Exam Vitals and nursing note reviewed.  Constitutional:      General: He is not in acute distress. HENT:     Head: Normocephalic and atraumatic.     Comments: Swelling right mandibular molar area extending to right chin.  Unable to open jaw Cardiovascular:     Rate and Rhythm: Normal rate and regular rhythm.     Heart sounds: Normal heart  sounds.  Pulmonary:     Effort: Pulmonary effort is normal.     Breath sounds: Normal breath sounds.  Abdominal:     Palpations: Abdomen is soft.     Tenderness: There is no abdominal tenderness.  Neurological:     Mental Status: Mental status is at baseline.     Labs on Admission: I have personally reviewed following labs and imaging studies  CBC: Recent Labs  Lab 06/19/23 0315  WBC 10.2  NEUTROABS 7.3  HGB 16.4  HCT 48.1  MCV 85.7  PLT 184   Basic Metabolic Panel: Recent Labs  Lab 06/19/23 0315  NA 132*  K 3.8  CL 98  CO2 24  GLUCOSE 254*  BUN 15  CREATININE 0.79  CALCIUM  8.5*   GFR: Estimated Creatinine Clearance: 163.4 mL/min (by C-G formula based on SCr of 0.79 mg/dL). Liver Function Tests: Recent Labs  Lab 06/19/23 0315  AST 18  ALT 29  ALKPHOS 55  BILITOT 0.9  PROT 7.3  ALBUMIN 3.5   No results for input(s): "LIPASE", "AMYLASE" in the last 168 hours. No results for input(s): "AMMONIA" in the last 168 hours. Coagulation Profile: No results for input(s): "INR", "PROTIME" in the last 168 hours. Cardiac Enzymes: No results for input(s): "CKTOTAL", "CKMB", "CKMBINDEX", "TROPONINI" in the last 168 hours. BNP (last 3 results) No results for input(s): "PROBNP" in the last 8760 hours. HbA1C: No results for input(s): "HGBA1C" in the last 72 hours. CBG: Recent Labs  Lab 06/19/23 0324  GLUCAP 258*   Lipid Profile: No results for input(s): "CHOL", "HDL", "LDLCALC", "TRIG", "CHOLHDL", "LDLDIRECT" in the last 72 hours. Thyroid Function Tests: No results for input(s): "TSH", "T4TOTAL", "FREET4", "T3FREE", "THYROIDAB" in the last 72 hours. Anemia Panel: No results for input(s): "VITAMINB12", "FOLATE", "FERRITIN", "TIBC", "IRON", "RETICCTPCT" in the last 72 hours. Urine analysis:    Component Value Date/Time   COLORURINE STRAW (A) 05/24/2021 1938   APPEARANCEUR HAZY (A) 05/24/2021 1938   APPEARANCEUR Clear 02/03/2014 2249   LABSPEC 1.030 05/24/2021  1938   LABSPEC 1.029 02/03/2014 2249   PHURINE 6.0 05/24/2021 1938   GLUCOSEU >=500 (A) 05/24/2021 1938   GLUCOSEU Negative 02/03/2014 2249   HGBUR SMALL (A) 05/24/2021 1938   BILIRUBINUR NEGATIVE 05/24/2021 1938   BILIRUBINUR Negative 02/03/2014 2249   KETONESUR NEGATIVE 05/24/2021 1938   PROTEINUR NEGATIVE 05/24/2021 1938   NITRITE NEGATIVE 05/24/2021 1938   LEUKOCYTESUR MODERATE (A) 05/24/2021 1938   LEUKOCYTESUR Negative 02/03/2014 2249    Radiological Exams on Admission: CT Maxillofacial W Contrast Result Date: 06/19/2023 CLINICAL DATA:  38 year old male with onset of tooth pain, associated unilateral facial swelling. EXAM: CT MAXILLOFACIAL WITH CONTRAST TECHNIQUE: Multidetector CT imaging of the maxillofacial structures was performed with intravenous contrast. Multiplanar CT image reconstructions were also generated. RADIATION DOSE REDUCTION: This exam was performed according to the departmental dose-optimization program which includes automated exposure control, adjustment of the mA and/or kV according to patient size and/or use of iterative reconstruction  technique. CONTRAST:  75mL OMNIPAQUE IOHEXOL 300 MG/ML  SOLN COMPARISON:  Neck CT 05/24/2017. FINDINGS: Osseous: No acute left side dental finding. Mandible, bilateral TMJ normally located. But carious right posterior dentition, maxillary and mandible molars. Pronounced periapical lucency at the carious right mandible molar with evidence cortical dehiscence series 4, image 33. Regional soft tissue swelling and inflammation, described below. Bilateral maxilla, zygoma, pterygoid, nasal bones appear intact. Visible skull base and cervical vertebrae appear intact and aligned. Visible calvarium intact. Orbits: Intact orbital walls. Globes and intraorbital soft tissues appear normal. Sinuses: Clear aside from opacified left maxillary sinus, which does not appear to be odontogenic on that side. Tympanic cavities and mastoids are well aerated. Soft  tissues: Negative visible thyroid, larynx, pharynx, parapharyngeal spaces, retropharyngeal space (partially retropharyngeal carotids, normal variant) left side submandibular and masticator spaces. There is coarse bilateral parotid gland heterogeneity suggesting sequelae of chronic or recurrent glandular inflammation. The submandibular glands appear spared, normal. Sublingual space remains normal. Mostly superficial soft tissue swelling and stranding along the right face, with confluent roughly 5 cm phlegmonous area lateral to the body of the right mandible (series 2, image 72) and evidence of small subperiosteal abscess overlying the mandible there on series 2, image 70 (about 12 by 4 by 12 mm (AP by transverse by CC). Thickening of the platysma, subcutaneous stranding extending inferiorly. No soft tissue gas. Subcentimeter but asymmetrically enhancing right level 1 and level 2 lymph nodes appear reactive. No other lymphadenopathy. Suboptimal intravascular contrast timing but the major vascular structures in the visible face and at the skull base appear to remain patent. Limited intracranial: Negative. Left vertebral artery appears dominant, normal variant. IMPRESSION: 1. Positive for odontogenic Right Face Cellulitis. Regional inflammation superimposed on carious right mandible molar with periapical lucency, dehiscence, and overlying small 12 mm Subperiosteal Abscess. Reactive right level 1 and 2 lymph nodes. No other complicating features. 2. Additional carious right maxillary dentition. Unrelated appearing left maxillary sinus disease. Chronic postinflammatory appearance of the parotid glands. Electronically Signed   By: Marlise Simpers M.D.   On: 06/19/2023 04:13   Data Reviewed for HPI: Relevant notes from primary care and specialist visits, past discharge summaries as available in EHR, including Care Everywhere. Prior diagnostic testing as pertinent to current admission diagnoses Updated medications and problem  lists for reconciliation ED course, including vitals, labs, imaging, treatment and response to treatment Triage notes, nursing and pharmacy notes and ED provider's notes Notable results as noted above in HPI      Assessment and Plan: * Facial cellulitis Dental abscess Unasyn  Pain control Soft diet  Uncontrolled type 2 diabetes mellitus with hyperglycemia, without long-term current use of insulin  (HCC) Sliding scale insulin  diet  Obesity, Class III, BMI 40-49.9 (morbid obesity) Risk of increased morbidity, current and future Dietary and lifestyle modification recommended        DVT prophylaxis: Early ambulation  Consults: none  Advance Care Planning:   Code Status: Full Code   Family Communication: Wife at bedside  Disposition Plan: Back to previous home environment  Severity of Illness: The appropriate patient status for this patient is OBSERVATION. Observation status is judged to be reasonable and necessary in order to provide the required intensity of service to ensure the patient's safety. The patient's presenting symptoms, physical exam findings, and initial radiographic and laboratory data in the context of their medical condition is felt to place them at decreased risk for further clinical deterioration. Furthermore, it is anticipated that the patient will be medically  stable for discharge from the hospital within 2 midnights of admission.   Author: Lanetta Pion, MD 06/19/2023 5:01 AM  For on call review [email or web address].

## 2023-06-19 NOTE — Progress Notes (Signed)
 Pharmacy Antibiotic Note  Bradley Hood is a 38 y.o. male admitted on 06/19/2023 with cellulitis.  Pharmacy has been consulted for Unasyn  dosing.  Plan: Unasyn  3 gm IV X 1 given in ED on 6/8 @ 0359.   - Will order Unasyn  3 gm IV Q6H to start on 6/9 @ 1000.   Height: 5\' 7"  (170.2 cm) Weight: 136.1 kg (300 lb) IBW/kg (Calculated) : 66.1  Temp (24hrs), Avg:98.4 F (36.9 C), Min:97.8 F (36.6 C), Max:98.7 F (37.1 C)  Recent Labs  Lab 06/19/23 0315  WBC 10.2  CREATININE 0.79    Estimated Creatinine Clearance: 163.4 mL/min (by C-G formula based on SCr of 0.79 mg/dL).    No Known Allergies  Antimicrobials this admission:   >>    >>   Dose adjustments this admission:   Microbiology results:  BCx:   UCx:    Sputum:    MRSA PCR:   Thank you for allowing pharmacy to be a part of this patient's care.  Jeramie Scogin D 06/19/2023 5:12 AM

## 2023-06-19 NOTE — ED Triage Notes (Signed)
 Arrived pov for right side facial swelling.  Per pt "I went to the walk in clinic today for my tooth pain/swelling, but the swelling has gotten worse."  Pt has noticeable swelling to right side of face

## 2023-06-19 NOTE — Hospital Course (Signed)
 Bradley Hood

## 2023-06-19 NOTE — TOC Initial Note (Signed)
 Transition of Care [facility]) - Initial/Assessment Note    Patient Details  Name: Bradley Hood MRN: [id number] Date of Birth: 1985/06/08  [facility]) CM/SW Contact:    Alexandra Ice, RN Phone Number: 06/19/2023, 8:33 AM  Clinical Narrative:                  Patient lives with spouse. He has PCP listed. Patient is listed as uninsured. TOC sent email to financial navigator for verification and assistance. No other discharge needs identified at this by [facility], will continue to follow.        Patient Goals and CMS Choice            Expected Discharge Plan and Services                                              Prior Living Arrangements/Services                       Activities of Daily Living   ADL Screening (condition at time of admission) Independently performs ADLs?: Yes (appropriate for developmental age) Is the patient deaf or have difficulty hearing?: No Does the patient have difficulty seeing, even when wearing glasses/contacts?: No Does the patient have difficulty concentrating, remembering, or making decisions?: No  Permission Sought/Granted                  Emotional Assessment              Admission diagnosis:  Dental abscess [K04.7] Facial cellulitis [L03.211] Patient Active Problem List   Diagnosis Date Noted   Facial cellulitis 06/19/2023   Obesity, Class III, BMI 40-49.9 (morbid obesity) 06/19/2023   Uncontrolled type 2 diabetes mellitus with hyperglycemia, without long-term current use of insulin  (HCC) 06/19/2023   Dental abscess 06/19/2023   Obesity, unspecified 12/03/2018   Uncontrolled type 2 diabetes mellitus 12/03/2018   PCP:  Eartha Gold, MD Pharmacy:   [id number] [place], [place] - [address] AT [facility] [address] [place] [address] Phone: [phone] Fax: [phone]     Social Drivers of Health (SDOH) Social  History: SDOH Screenings   Food Insecurity: No Food Insecurity (06/19/2023)  Housing: Low Risk  (06/19/2023)  Transportation Needs: No Transportation Needs (06/19/2023)  Utilities: Not At Risk (06/19/2023)  Tobacco Use: Low Risk  (06/19/2023)   SDOH Interventions:     Readmission Risk Interventions     No data to display

## 2023-06-19 NOTE — Plan of Care (Signed)
 Problem: Education: Goal: Ability to describe self-care measures that may prevent or decrease complications (Diabetes Survival Skills Education) will improve 06/19/2023 2059 by Bobbette Burns, RN Outcome: Progressing 06/19/2023 2059 by Bobbette Burns, RN Outcome: Progressing Goal: Individualized Educational Video(s) 06/19/2023 2059 by Clayborne Cunning D, RN Outcome: Progressing 06/19/2023 2059 by Clayborne Cunning D, RN Outcome: Progressing   Problem: Coping: Goal: Ability to adjust to condition or change in health will improve 06/19/2023 2059 by Bobbette Burns, RN Outcome: Progressing 06/19/2023 2059 by Clayborne Cunning D, RN Outcome: Progressing   Problem: Fluid Volume: Goal: Ability to maintain a balanced intake and output will improve 06/19/2023 2059 by Clayborne Cunning D, RN Outcome: Progressing 06/19/2023 2059 by Bobbette Burns, RN Outcome: Progressing   Problem: Health Behavior/Discharge Planning: Goal: Ability to identify and utilize available resources and services will improve 06/19/2023 2059 by Bobbette Burns, RN Outcome: Progressing 06/19/2023 2059 by Clayborne Cunning D, RN Outcome: Progressing Goal: Ability to manage health-related needs will improve 06/19/2023 2059 by Bobbette Burns, RN Outcome: Progressing 06/19/2023 2059 by Clayborne Cunning D, RN Outcome: Progressing   Problem: Metabolic: Goal: Ability to maintain appropriate glucose levels will improve 06/19/2023 2059 by Clayborne Cunning D, RN Outcome: Progressing 06/19/2023 2059 by Clayborne Cunning D, RN Outcome: Progressing   Problem: Nutritional: Goal: Maintenance of adequate nutrition will improve 06/19/2023 2059 by Clayborne Cunning D, RN Outcome: Progressing 06/19/2023 2059 by Clayborne Cunning D, RN Outcome: Progressing Goal: Progress toward achieving an optimal weight will improve 06/19/2023 2059 by Clayborne Cunning D, RN Outcome: Progressing 06/19/2023 2059 by Clayborne Cunning D, RN Outcome: Progressing   Problem:  Skin Integrity: Goal: Risk for impaired skin integrity will decrease 06/19/2023 2059 by Clayborne Cunning D, RN Outcome: Progressing 06/19/2023 2059 by Clayborne Cunning D, RN Outcome: Progressing   Problem: Tissue Perfusion: Goal: Adequacy of tissue perfusion will improve 06/19/2023 2059 by Clayborne Cunning D, RN Outcome: Progressing 06/19/2023 2059 by Bobbette Burns, RN Outcome: Progressing   Problem: Education: Goal: Knowledge of General Education information will improve Description: Including pain rating scale, medication(s)/side effects and non-pharmacologic comfort measures 06/19/2023 2059 by Clayborne Cunning D, RN Outcome: Progressing 06/19/2023 2059 by Bobbette Burns, RN Outcome: Progressing   Problem: Health Behavior/Discharge Planning: Goal: Ability to manage health-related needs will improve 06/19/2023 2059 by Bobbette Burns, RN Outcome: Progressing 06/19/2023 2059 by Clayborne Cunning D, RN Outcome: Progressing   Problem: Clinical Measurements: Goal: Ability to maintain clinical measurements within normal limits will improve 06/19/2023 2059 by Clayborne Cunning D, RN Outcome: Progressing 06/19/2023 2059 by Clayborne Cunning D, RN Outcome: Progressing Goal: Will remain free from infection 06/19/2023 2059 by Clayborne Cunning D, RN Outcome: Progressing 06/19/2023 2059 by Clayborne Cunning D, RN Outcome: Progressing Goal: Diagnostic test results will improve 06/19/2023 2059 by Bobbette Burns, RN Outcome: Progressing 06/19/2023 2059 by Clayborne Cunning D, RN Outcome: Progressing Goal: Respiratory complications will improve 06/19/2023 2059 by Clayborne Cunning D, RN Outcome: Progressing 06/19/2023 2059 by Clayborne Cunning D, RN Outcome: Progressing Goal: Cardiovascular complication will be avoided 06/19/2023 2059 by Clayborne Cunning D, RN Outcome: Progressing 06/19/2023 2059 by Bobbette Burns, RN Outcome: Progressing   Problem: Activity: Goal: Risk for activity intolerance will  decrease 06/19/2023 2059 by Clayborne Cunning D, RN Outcome: Progressing 06/19/2023 2059 by Clayborne Cunning D, RN Outcome: Progressing   Problem: Nutrition: Goal: Adequate nutrition will be maintained 06/19/2023 2059 by Clayborne Cunning D, RN Outcome: Progressing 06/19/2023 2059 by Bobbette Burns, RN Outcome: Progressing  Problem: Coping: Goal: Level of anxiety will decrease 06/19/2023 2059 by Clayborne Cunning D, RN Outcome: Progressing 06/19/2023 2059 by Clayborne Cunning D, RN Outcome: Progressing   Problem: Elimination: Goal: Will not experience complications related to bowel motility 06/19/2023 2059 by Bobbette Burns, RN Outcome: Progressing 06/19/2023 2059 by Clayborne Cunning D, RN Outcome: Progressing Goal: Will not experience complications related to urinary retention 06/19/2023 2059 by Bobbette Burns, RN Outcome: Progressing 06/19/2023 2059 by Clayborne Cunning D, RN Outcome: Progressing   Problem: Pain Managment: Goal: General experience of comfort will improve and/or be controlled 06/19/2023 2059 by Bobbette Burns, RN Outcome: Progressing 06/19/2023 2059 by Clayborne Cunning D, RN Outcome: Progressing   Problem: Safety: Goal: Ability to remain free from injury will improve 06/19/2023 2059 by Bobbette Burns, RN Outcome: Progressing 06/19/2023 2059 by Clayborne Cunning D, RN Outcome: Progressing   Problem: Skin Integrity: Goal: Risk for impaired skin integrity will decrease 06/19/2023 2059 by Clayborne Cunning D, RN Outcome: Progressing 06/19/2023 2059 by Bobbette Burns, RN Outcome: Progressing

## 2023-06-19 NOTE — Assessment & Plan Note (Signed)
 Dental abscess Unasyn  Pain control Soft diet

## 2023-06-19 NOTE — ED Provider Notes (Signed)
 [facility] Provider Note    Event Date/Time   First MD Initiated Contact with Patient 06/19/23 [phone]     (approximate)   History   Facial Swelling   HPI  Bradley Hood is a 38 y.o. male with history of diabetes who presents to the emergency department with right-sided facial swelling, dental pain.  Symptoms started yesterday morning and progressively worsened throughout the day.  Was seen urgent care and given antibiotics and pain medication.  Did take 1 dose of antibiotics today.  No fevers.  He is not sure what his blood sugar is running.  States it is painful to talk.  No difficulty swallowing, breathing.   History provided by patient, wife.    Past Medical History:  Diagnosis Date   Diabetes mellitus without complication (HCC)     Past Surgical History:  Procedure Laterality Date   NO PAST SURGERIES      MEDICATIONS:  Prior to Admission medications   Medication Sig Start Date End Date Taking? Authorizing Provider  acetaminophen -codeine (TYLENOL  #3) 300-30 MG tablet Take 1 tablet by mouth every 6 (six) hours as needed for moderate pain (pain score 4-6). 06/18/23   Reena Canning, NP  amoxicillin -clavulanate (AUGMENTIN ) 875-125 MG tablet Take 1 tablet by mouth every 12 (twelve) hours. 06/18/23   White, Maybelle Spatz, NP  benzonatate  (TESSALON ) 100 MG capsule Take 1 capsule (100 mg total) by mouth 3 (three) times daily as needed for cough. 11/29/22   Ann Keto, MD  chlorhexidine  (PERIDEX ) 0.12 % solution Use as directed 15 mLs in the mouth or throat 2 (two) times daily. 04/28/23   Buena Carmine, NP  ibuprofen  (ADVIL ) 800 MG tablet Take 1 tablet (800 mg total) by mouth every 8 (eight) hours as needed (pain). 11/29/22   Ann Keto, MD  loperamide  (IMODIUM ) 2 MG capsule Take 1 capsule (2 mg total) by mouth 4 (four) times daily as needed for diarrhea or loose stools. 04/08/23   Reena Canning, NP  metFORMIN (GLUCOPHAGE) 500 MG tablet  Take by mouth 2 (two) times daily with a meal.    [provider]  ondansetron  (ZOFRAN -ODT) 4 MG disintegrating tablet Take 1 tablet (4 mg total) by mouth every 8 (eight) hours as needed. 04/08/23   Reena Canning, NP    Physical Exam   Triage Vital Signs: ED Triage Vitals  Encounter Vitals Group     BP 06/19/23 0216 (!) 147/82     Systolic BP Percentile --      Diastolic BP Percentile --      Pulse Rate 06/19/23 0216 85     Resp 06/19/23 0216 19     Temp 06/19/23 0216 98.7 F (37.1 C)     Temp Source 06/19/23 0216 Oral     SpO2 06/19/23 0216 96 %     Weight 06/19/23 0217 300 lb (136.1 kg)     Height 06/19/23 0217 5\' 7"  (1.702 m)     Head Circumference --      Peak Flow --      Pain Score 06/19/23 0327 8     Pain Loc --      Pain Education --      Exclude from Growth Chart --     Most recent vital signs: Vitals:   06/19/23 0216 06/19/23 0328  BP: (!) 147/82 116/68  Pulse: 85 87  Resp: 19 19  Temp: 98.7 F (37.1 C) 98.7 F (37.1 C)  SpO2:  96% 92%    CONSTITUTIONAL: Alert, responds appropriately to questions. Well-appearing; well-nourished HEAD: Normocephalic, atraumatic EYES: Conjunctivae clear, pupils appear equal, sclera nonicteric ENT: normal nose; moist mucous membranes, swelling noted to the right side of the face with associated tenderness but no redness or increased warmth.  No Ludwig's angina.  Tongue sits flat in the bottom of the mouth.  He does have some trismus due to pain.  Normal phonation.  Airway patent.  No tonsillar hypertrophy or exudate.  No uvular deviation.  No stridor.  Dental caries noted.  No sign of drainable dental abscess. NECK: Supple, normal ROM, no cervical lymphadenopathy appreciated, trachea midline, no thyromegaly CARD: RRR; S1 and S2 appreciated RESP: Normal chest excursion without splinting or tachypnea; breath sounds clear and equal bilaterally; no wheezes, no rhonchi, no rales, no hypoxia or respiratory distress, speaking  full sentences ABD/GI: Non-distended; soft, non-tender, no rebound, no guarding, no peritoneal signs BACK: The back appears normal EXT: Normal ROM in all joints; no deformity noted, no edema SKIN: Normal color for age and race; warm; no rash on exposed skin NEURO: Moves all extremities equally, normal speech PSYCH: The patient's mood and manner are appropriate.   ED Results / Procedures / Treatments   LABS: (all labs ordered are listed, but only abnormal results are displayed) Labs Reviewed  COMPREHENSIVE METABOLIC PANEL WITH GFR - Abnormal; Notable for the following components:      Result Value   Sodium 132 (*)    Glucose, Bld 254 (*)    Calcium  8.5 (*)    All other components within normal limits  CBG MONITORING, ED - Abnormal; Notable for the following components:   Glucose-Capillary 258 (*)    All other components within normal limits  CBC WITH DIFFERENTIAL/PLATELET  HIV ANTIBODY (ROUTINE TESTING W REFLEX)  HEMOGLOBIN A1C     EKG:     RADIOLOGY: My personal review and interpretation of imaging: CT scan shows facial cellulitis.  I have personally reviewed all radiology reports.   CT Maxillofacial W Contrast Result Date: 06/19/2023 CLINICAL DATA:  38 year old male with onset of tooth pain, associated unilateral facial swelling. EXAM: CT MAXILLOFACIAL WITH CONTRAST TECHNIQUE: Multidetector CT imaging of the maxillofacial structures was performed with intravenous contrast. Multiplanar CT image reconstructions were also generated. RADIATION DOSE REDUCTION: This exam was performed according to the departmental dose-optimization program which includes automated exposure control, adjustment of the mA and/or kV according to patient size and/or use of iterative reconstruction technique. CONTRAST:  75mL OMNIPAQUE IOHEXOL 300 MG/ML  SOLN COMPARISON:  Neck CT 05/24/2017. FINDINGS: Osseous: No acute left side dental finding. Mandible, bilateral TMJ normally located. But carious right  posterior dentition, maxillary and mandible molars. Pronounced periapical lucency at the carious right mandible molar with evidence cortical dehiscence series 4, image 33. Regional soft tissue swelling and inflammation, described below. Bilateral maxilla, zygoma, pterygoid, nasal bones appear intact. Visible skull base and cervical vertebrae appear intact and aligned. Visible calvarium intact. Orbits: Intact orbital walls. Globes and intraorbital soft tissues appear normal. Sinuses: Clear aside from opacified left maxillary sinus, which does not appear to be odontogenic on that side. Tympanic cavities and mastoids are well aerated. Soft tissues: Negative visible thyroid, larynx, pharynx, parapharyngeal spaces, retropharyngeal space (partially retropharyngeal carotids, normal variant) left side submandibular and masticator spaces. There is coarse bilateral parotid gland heterogeneity suggesting sequelae of chronic or recurrent glandular inflammation. The submandibular glands appear spared, normal. Sublingual space remains normal. Mostly superficial soft tissue swelling and stranding along the  right face, with confluent roughly 5 cm phlegmonous area lateral to the body of the right mandible (series 2, image 72) and evidence of small subperiosteal abscess overlying the mandible there on series 2, image 70 (about 12 by 4 by 12 mm (AP by transverse by CC). Thickening of the platysma, subcutaneous stranding extending inferiorly. No soft tissue gas. Subcentimeter but asymmetrically enhancing right level 1 and level 2 lymph nodes appear reactive. No other lymphadenopathy. Suboptimal intravascular contrast timing but the major vascular structures in the visible face and at the skull base appear to remain patent. Limited intracranial: Negative. Left vertebral artery appears dominant, normal variant. IMPRESSION: 1. Positive for odontogenic Right Face Cellulitis. Regional inflammation superimposed on carious right mandible  molar with periapical lucency, dehiscence, and overlying small 12 mm Subperiosteal Abscess. Reactive right level 1 and 2 lymph nodes. No other complicating features. 2. Additional carious right maxillary dentition. Unrelated appearing left maxillary sinus disease. Chronic postinflammatory appearance of the parotid glands. Electronically Signed   By: Marlise Simpers M.D.   On: 06/19/2023 04:13     PROCEDURES:  Critical Care performed: No     Procedures    IMPRESSION / MDM / ASSESSMENT AND PLAN / ED COURSE  I reviewed the triage vital signs and the nursing notes.    Patient here with right-sided facial swelling, dental pain.  The patient is on the cardiac monitor to evaluate for evidence of arrhythmia and/or significant heart rate changes.   DIFFERENTIAL DIAGNOSIS (includes but not limited to):   Odontogenic infection, facial cellulitis, sialoadenitis, doubt parotitis   Patient's presentation is most consistent with acute presentation with potential threat to life or bodily function.   PLAN: Will obtain labs, CT of the face.  Will give IV fluids, pain and nausea medicine, antibiotics.   MEDICATIONS GIVEN IN ED: Medications  acetaminophen  (TYLENOL ) tablet 650 mg (has no administration in time range)    Or  acetaminophen  (TYLENOL ) suppository 650 mg (has no administration in time range)  ondansetron  (ZOFRAN ) tablet 4 mg (has no administration in time range)    Or  ondansetron  (ZOFRAN ) injection 4 mg (has no administration in time range)  insulin  aspart (novoLOG ) injection 0-20 Units (has no administration in time range)  insulin  aspart (novoLOG ) injection 0-5 Units (has no administration in time range)  HYDROcodone -acetaminophen  (NORCO/VICODIN) 5-325 MG per tablet 1-2 tablet (has no administration in time range)  ketorolac  (TORADOL ) 30 MG/ML injection 30 mg (has no administration in time range)  Ampicillin -Sulbactam (UNASYN ) 3 g in sodium chloride  0.9 % 100 mL IVPB (has no  administration in time range)  sodium chloride  0.9 % bolus 1,000 mL (1,000 mLs Intravenous New Bag/Given 06/19/23 0358)  ketorolac  (TORADOL ) 30 MG/ML injection 30 mg (30 mg Intravenous Given 06/19/23 0355)  morphine (PF) 4 MG/ML injection 4 mg (4 mg Intravenous Given 06/19/23 0356)  ondansetron  (ZOFRAN ) injection 4 mg (4 mg Intravenous Given 06/19/23 0354)  Ampicillin -Sulbactam (UNASYN ) 3 g in sodium chloride  0.9 % 100 mL IVPB (3 g Intravenous New Bag/Given 06/19/23 0359)  iohexol (OMNIPAQUE) 300 MG/ML solution 75 mL (75 mLs Intravenous Contrast Given 06/19/23 0346)     ED COURSE: White blood cell count is 10.2.  Glucose of 254 with normal bicarb and anion gap.  CT scan reviewed and interpreted by myself and the radiologist and shows right-sided facial cellulitis with overlying small 12 mm subperiosteal abscess from the right mandibular molar.  I have recommended admission for IV antibiotics and monitoring given patient's facial swelling, trismus  and he agrees.  He is not septic at this time.  He understands that he will need to follow-up with dentistry as an outpatient.  Will hold on Decadron  given poorly controlled diabetes.   CONSULTS:  Consulted and discussed patient's case with hospitalist, Dr. Vallarie Gauze.  I have recommended admission and consulting physician agrees and will place admission orders.  Patient (and family if present) agree with this plan.   I reviewed all nursing notes, vitals, pertinent previous records.  All labs, EKGs, imaging ordered have been independently reviewed and interpreted by myself.    OUTSIDE RECORDS REVIEWED: Reviewed urgent care note from yesterday.  Reviewed last internal medicine note in January 2024.       FINAL CLINICAL IMPRESSION(S) / ED DIAGNOSES   Final diagnoses:  Facial cellulitis  Dental abscess     Rx / DC Orders   ED Discharge Orders     None        Note:  This document was prepared using Dragon voice recognition software and may include  unintentional dictation errors.   Yasmen Cortner, Clover Dao, DO 06/19/23 [phone]

## 2023-06-19 NOTE — Assessment & Plan Note (Signed)
 Risk of increased morbidity, current and future Dietary and lifestyle modification recommended

## 2023-06-19 NOTE — Progress Notes (Signed)
 Briefly, patient is a 38 year old man with diabetes who was admitted earlier this morning for facial cellulitis secondary to odontogenic/periapical abscess.  Patient was started on Unasyn  and Toradol .  This morning, patient states he feels better with less swelling and less pain although he still has significant swelling and pain.  Plan is to continue present management with Unasyn .  Patient will need his tooth pulled, unclear if this can be done as an inpatient or will need to be done as an outpatient.  Continue SSI high dose for blood sugar control.

## 2023-06-20 DIAGNOSIS — L03211 Cellulitis of face: Secondary | ICD-10-CM

## 2023-06-20 LAB — BASIC METABOLIC PANEL WITH GFR
Anion gap: 6 (ref 5–15)
BUN: 12 mg/dL (ref 6–20)
CO2: 27 mmol/L (ref 22–32)
Calcium: 8.4 mg/dL — ABNORMAL LOW (ref 8.9–10.3)
Chloride: 103 mmol/L (ref 98–111)
Creatinine, Ser: 0.9 mg/dL (ref 0.61–1.24)
GFR, Estimated: >60 mL/min (ref >60)
Glucose, Bld: 166 mg/dL — ABNORMAL HIGH (ref 70–99)
Potassium: 3.8 mmol/L (ref 3.5–5.1)
Sodium: 136 mmol/L (ref 135–145)

## 2023-06-20 LAB — MAGNESIUM: Magnesium: 2.2 mg/dL (ref 1.7–2.4)

## 2023-06-20 LAB — CBC
HCT: 47.5 % (ref 39.0–52.0)
Hemoglobin: 16.4 g/dL (ref 13.0–17.0)
MCH: 29.5 pg (ref 26.0–34.0)
MCHC: 34.5 g/dL (ref 30.0–36.0)
MCV: 85.4 fL (ref 80.0–100.0)
Platelets: 181 10*3/uL (ref 150–400)
RBC: 5.56 MIL/uL (ref 4.22–5.81)
RDW: 12.9 % (ref 11.5–15.5)
WBC: 6.1 10*3/uL (ref 4.0–10.5)
nRBC: 0 % (ref 0.0–0.2)

## 2023-06-20 LAB — GLUCOSE, CAPILLARY
Glucose-Capillary: 174 mg/dL — ABNORMAL HIGH (ref 70–99)
Glucose-Capillary: 240 mg/dL — ABNORMAL HIGH (ref 70–99)
Glucose-Capillary: 130 mg/dL — ABNORMAL HIGH (ref 70–99)
Glucose-Capillary: 241 mg/dL — ABNORMAL HIGH (ref 70–99)

## 2023-06-20 LAB — HEMOGLOBIN A1C
Hgb A1c MFr Bld: 10.6 % — ABNORMAL HIGH (ref 4.8–5.6)
Mean Plasma Glucose: 258 mg/dL

## 2023-06-20 LAB — HIV ANTIBODY (ROUTINE TESTING W REFLEX): HIV Screen 4th Generation wRfx: NONREACTIVE

## 2023-06-20 LAB — PHOSPHORUS: Phosphorus: 3.2 mg/dL (ref 2.5–4.6)

## 2023-06-20 LAB — VITAMIN D 25 HYDROXY (VIT D DEFICIENCY, FRACTURES): Vit D, 25-Hydroxy: 23.45 ng/mL — ABNORMAL LOW (ref 30–100)

## 2023-06-20 MED ORDER — VITAMIN D (ERGOCALCIFEROL) 1.25 MG (50000 UNIT) PO CAPS
50000.0000 [IU] | ORAL_CAPSULE | ORAL | Status: DC
  Administered 2023-06-20: 50000 [IU] via ORAL
  Filled 2023-06-20: qty 1

## 2023-06-20 MED ORDER — ENOXAPARIN SODIUM 80 MG/0.8ML IJ SOSY
0.5000 mg/kg | PREFILLED_SYRINGE | INTRAMUSCULAR | Status: DC
  Administered 2023-06-20: 70 mg via SUBCUTANEOUS
  Filled 2023-06-20: qty 0.7

## 2023-06-20 MED ORDER — LIVING WELL WITH DIABETES BOOK - IN SPANISH
Freq: Once | Status: AC
  Filled 2023-06-20: qty 1

## 2023-06-20 MED ORDER — ENOXAPARIN SODIUM 40 MG/0.4ML IJ SOSY: 40.0000 mg | PREFILLED_SYRINGE | Freq: Every evening | INTRAMUSCULAR | Status: DC

## 2023-06-20 NOTE — Progress Notes (Signed)
 PHARMACIST - PHYSICIAN COMMUNICATION  CONCERNING:  Enoxaparin (Lovenox) for DVT Prophylaxis    RECOMMENDATION: Patient was prescribed enoxaprin 40mg  q24 hours for VTE prophylaxis.   Filed Weights   06/19/23 0217 06/19/23 0630  Weight: 136.1 kg (300 lb) (!) 138.7 kg (305 lb 12.8 oz)    Body mass index is 47.9 kg/m.  Estimated Creatinine Clearance: 146.8 mL/min (by C-G formula based on SCr of 0.9 mg/dL).   Based on [facility] policy patient is candidate for enoxaparin 0.5mg /kg TBW SQ every 24 hours based on BMI being >30.   DESCRIPTION: Pharmacy has adjusted enoxaparin dose per [facility] policy.  Patient is now receiving enoxaparin 0.5 mg/kg every 24 hours    Adalberto Acton, PharmD Clinical Pharmacist  06/20/2023 5:34 PM

## 2023-06-20 NOTE — Plan of Care (Signed)

## 2023-06-20 NOTE — Inpatient Diabetes Management (Signed)
 Inpatient Diabetes Program Recommendations  AACE/ADA: New Consensus Statement on Inpatient Glycemic Control (2015)  Target Ranges:  Prepandial:   less than 140 mg/dL      Peak postprandial:   less than 180 mg/dL (1-2 hours)      Critically ill patients:  140 - 180 mg/dL   Lab Results  Component Value Date   GLUCAP 174 (H) 06/20/2023   HGBA1C 10.6 (H) 06/19/2023    Review of Glycemic Control  Latest Reference Range & Units 06/19/23 03:24 06/19/23 08:44 06/19/23 11:59 06/19/23 17:19 06/19/23 20:45 06/20/23 08:41  Glucose-Capillary 70 - 99 mg/dL 295 (H) 621 (H) 308 (H) 195 (H) 256 (H) 174 (H)  (H): Data is abnormally high  Diabetes history: DM2 Outpatient Diabetes medications: Metformin 500 mg BID  Current orders for Inpatient glycemic control: Novolog  0-20 units TID and 0-5 units QHS   Met with patient and significant other at bedside.  Reviewed patient's current A1c of 10.6% (average BG of 257 mg/dL). Explained what a A1c is and what it measures. Also reviewed goal A1c with patient, importance of good glucose control @ home, and blood sugar goals.  He does not take Metformin consistently due to GI upset.  Ordered the LWWDM booklet in Spanish.  Educated on The Plate Method, CHO's, portion control, avoiding caloric beverages, CBGs at home fasting and mid afternoon, F/U with PCP every 3 months, bring meter to PCP office, long and short term complications of uncontrolled BG, and importance of exercise.  He does not have insurance and will need a glucometer at discharge.  Spoke with Shawn, TOC and she can provide a MATCH letter at DC for a glucometer along with any other medications he may need.  He will need close follow up regarding DM management.    Thank you, Hays Lipschutz, MSN, CDCES Diabetes Coordinator Inpatient Diabetes Program [phone] (team pager from 8a-5p)

## 2023-06-20 NOTE — Progress Notes (Signed)
 [facility] Progress Note  Patient: Bradley Hood    ZOX:[id number]  DOA: 06/19/2023     Date of Service: the patient was seen and examined on 06/20/2023  Chief Complaint  Patient presents with   Facial Swelling   Brief hospital course:  Bradley Hood is a 38 y.o. male with medical history significant for DM and morbid obesity being admitted with facial cellulitis secondary to odontogenic/periapical abscess.  Was seen in urgent care the day prior and started on Augmentin  but took just 1 dose but had worsening symptoms.  With worsening facial swelling right mandibular area and having difficulty opening his mouth.  He denies fever or chills. ED course and data review: Vitals unremarkable Labs notable for elevated blood sugar of 254 but normal WBC and otherwise unremarkabl CT.Maxillofacial showing the following: IMPRESSION: 1. Positive for odontogenic Right Face Cellulitis. Regional inflammation superimposed on carious right mandible molar with periapical lucency, dehiscence, and overlying small 12 mm Subperiosteal Abscess. Reactive right level 1 and 2 lymph nodes. No other complicating features.   2. Additional carious right maxillary dentition. Unrelated appearing left maxillary sinus disease. Chronic postinflammatory appearance of the parotid glands.   Patient treated with Unasyn  and given Toradol  for pain and an NS bolus   Hospitalist consulted for admission.      Assessment and Plan:  # Facial cellulitis Dental abscess Unasyn  Pain control Soft diet   Uncontrolled type 2 diabetes mellitus with hyperglycemia, without long-term current use of insulin  (HCC) Sliding scale insulin  diet   Vitamin D insufficiency, vit D level 23, started vitamin D 50,000 units p.o. weekly.  Follow-up with PCP to repeat vitamin D level after 3 to 6 months.   Obesity, Class III, BMI 40-49.9 (morbid obesity) Body mass index is 47.9 kg/m.  Interventions:Calorie restricted diet and daily exercise  advised to lose body weight.  Lifestyle modification discussed.     Diet: Carb modified diet DVT Prophylaxis: Subcutaneous Lovenox   Advance goals of care discussion: Full code  Family Communication: family was present at bedside, at the time of interview.  The pt provided permission to discuss medical plan with the family. Opportunity was given to ask question and all questions were answered satisfactorily.   Disposition:  Pt is from home, admitted with dental abscess, still on IV antibiotics, which precludes a safe discharge. Discharge to home, when stable, most likely tomorrow a.m.  Subjective: No significant events overnight, dental abscess pain is improving, patient is able to swallow.  As per patient after pain medications taken in the morning his pain is much better now. Plans to continue IV antibiotics today and discharge tomorrow a.m. if remains stable  Physical Exam: General: NAD, lying comfortably Appear in no distress, affect appropriate Eyes: PERRLA ENT: Oral Mucosa Clear, moist, dental caries, right mandibular mild tenderness Neck: no JVD,  Cardiovascular: S1 and S2 Present, no Murmur,  Respiratory: good respiratory effort, Bilateral Air entry equal and Decreased, no Crackles, no wheezes Abdomen: Bowel Sound present, Soft and no tenderness,  Skin: no rashes Extremities: no Pedal edema, no calf tenderness Neurologic: without any new focal findings Gait not checked due to patient safety concerns  Vitals:   06/19/23 2047 06/20/23 0415 06/20/23 0729 06/20/23 1528  BP: (!) 126/91 107/77 120/70 132/79  Pulse: 70 (!) 58 64 66  Resp: 20 18 17 17   Temp: 98.2 F (36.8 C) 97.7 F (36.5 C) 97.9 F (36.6 C) 98.2 F (36.8 C)  TempSrc: Oral  Oral   SpO2: 98% 97%  97% 97%  Weight:      Height:        Intake/Output Summary (Last 24 hours) at 06/20/2023 1730 Last data filed at 06/20/2023 1500 Gross per 24 hour  Intake 1540 ml  Output --  Net 1540 ml   Filed Weights    06/19/23 0217 06/19/23 0630  Weight: 136.1 kg (!) 138.7 kg    Data Reviewed: I have personally reviewed and interpreted daily labs, tele strips, imagings as discussed above. I reviewed all nursing notes, pharmacy notes, vitals, pertinent old records I have discussed plan of care as described above with RN and patient/family.  CBC: Recent Labs  Lab 06/19/23 0315 06/20/23 0924  WBC 10.2 6.1  NEUTROABS 7.3  --   HGB 16.4 16.4  HCT 48.1 47.5  MCV 85.7 85.4  PLT 184 181   Basic Metabolic Panel: Recent Labs  Lab 06/19/23 0315 06/20/23 0924  NA 132* 136  K 3.8 3.8  CL 98 103  CO2 24 27  GLUCOSE 254* 166*  BUN 15 12  CREATININE 0.79 0.90  CALCIUM  8.5* 8.4*  MG  --  2.2  PHOS  --  3.2    Studies: No results found.  Scheduled Meds:  insulin  aspart  0-20 Units Subcutaneous TID WC   insulin  aspart  0-5 Units Subcutaneous QHS   Continuous Infusions:  ampicillin -sulbactam (UNASYN ) IV 3 g (06/20/23 1659)   PRN Meds: acetaminophen  **OR** acetaminophen , HYDROcodone -acetaminophen , ketorolac , ondansetron  **OR** ondansetron  (ZOFRAN ) IV  Time spent: 35 minutes  Author: Althia Atlas. MD [facility] 06/20/2023 5:30 PM  To reach On-call, see care teams to locate the attending and reach out to them via [email or web address]. If 7PM-7AM, please contact night-coverage If you still have difficulty reaching the attending provider, please page the [facility] (Director on Call) for [facility] on amion for assistance.

## 2023-06-21 ENCOUNTER — Other Ambulatory Visit: Payer: Self-pay

## 2023-06-21 LAB — CBC
HCT: 44.8 % (ref 39.0–52.0)
Hemoglobin: 15.3 g/dL (ref 13.0–17.0)
MCH: 29 pg (ref 26.0–34.0)
MCHC: 34.2 g/dL (ref 30.0–36.0)
MCV: 85 fL (ref 80.0–100.0)
Platelets: 179 10*3/uL (ref 150–400)
RBC: 5.27 MIL/uL (ref 4.22–5.81)
RDW: 12.5 % (ref 11.5–15.5)
WBC: 7 10*3/uL (ref 4.0–10.5)
nRBC: 0 % (ref 0.0–0.2)

## 2023-06-21 LAB — BASIC METABOLIC PANEL WITH GFR
Anion gap: 6 (ref 5–15)
BUN: 16 mg/dL (ref 6–20)
CO2: 27 mmol/L (ref 22–32)
Calcium: 8.5 mg/dL — ABNORMAL LOW (ref 8.9–10.3)
Chloride: 103 mmol/L (ref 98–111)
Creatinine, Ser: 0.89 mg/dL (ref 0.61–1.24)
GFR, Estimated: >60 mL/min (ref >60)
Glucose, Bld: 174 mg/dL — ABNORMAL HIGH (ref 70–99)
Potassium: 3.8 mmol/L (ref 3.5–5.1)
Sodium: 136 mmol/L (ref 135–145)

## 2023-06-21 LAB — PHOSPHORUS: Phosphorus: 3.6 mg/dL (ref 2.5–4.6)

## 2023-06-21 LAB — GLUCOSE, CAPILLARY
Glucose-Capillary: 175 mg/dL — ABNORMAL HIGH (ref 70–99)
Glucose-Capillary: 210 mg/dL — ABNORMAL HIGH (ref 70–99)

## 2023-06-21 LAB — MAGNESIUM: Magnesium: 2 mg/dL (ref 1.7–2.4)

## 2023-06-21 MED ORDER — VITAMIN D (ERGOCALCIFEROL) 1.25 MG (50000 UNIT) PO CAPS
50000.0000 [IU] | ORAL_CAPSULE | ORAL | 0 refills | Status: AC
  Filled 2023-06-21: qty 12, 84d supply, fill #0

## 2023-06-21 MED ORDER — ACETAMINOPHEN 325 MG PO TABS: 650.0000 mg | ORAL_TABLET | Freq: Four times a day (QID) | ORAL | Status: AC | PRN

## 2023-06-21 MED ORDER — AMOXICILLIN-POT CLAVULANATE 875-125 MG PO TABS
1.0000 | ORAL_TABLET | Freq: Two times a day (BID) | ORAL | 0 refills | Status: AC
  Filled 2023-06-21: qty 14, 7d supply, fill #0

## 2023-06-21 MED ORDER — IBUPROFEN 200 MG PO TABS: 400.0000 mg | ORAL_TABLET | Freq: Three times a day (TID) | ORAL | Status: AC | PRN

## 2023-06-21 MED ORDER — CHLORHEXIDINE GLUCONATE 0.12 % MT SOLN
15.0000 mL | Freq: Two times a day (BID) | OROMUCOSAL | 0 refills | Status: AC
  Filled 2023-06-21: qty 473, 16d supply, fill #0

## 2023-06-21 NOTE — Progress Notes (Signed)
 Patient being discharged, all discharge education and teaching provided. All questions answered. IV removed, all belongings sent home with patient.

## 2023-06-21 NOTE — TOC Transition Note (Signed)
 Transition of Care [facility]) - Discharge Note   Patient Details  Name: Bradley Hood MRN: [id number] Date of Birth: [birth year]  Transition of Care [employer]) CM/SW Contact:  Alexandra Ice, RN Phone Number: 06/21/2023, 12:27 PM   Clinical Narrative:     Patient has discharge order in place. Patient to discharge home. No TOC needs identified  Final next level of care: Home/Self Care Barriers to Discharge: Barriers Resolved   Patient Goals and CMS Choice            Discharge Placement                  Name of family member notified: Jenette Mitchell Patient and family notified of of transfer: 06/21/23  Discharge Plan and Services Additional resources added to the After Visit Summary for                    DME Agency: NA       HH Arranged: NA          Social Drivers of Health (SDOH) Interventions SDOH Screenings   Food Insecurity: No Food Insecurity (06/19/2023)  Housing: Low Risk  (06/19/2023)  Transportation Needs: No Transportation Needs (06/19/2023)  Utilities: Not At Risk (06/19/2023)  Tobacco Use: Low Risk  (06/19/2023)     Readmission Risk Interventions     No data to display

## 2023-06-21 NOTE — Discharge Instructions (Signed)

## 2023-06-21 NOTE — Discharge Summary (Signed)
 [facility] Discharge Summary   Patient: Bradley Hood ZOX:[id number]  PCP: Eartha Gold, MD  Date of admission: 06/19/2023   Date of discharge:  06/21/2023     Discharge Diagnoses:  Principal Problem:   Facial cellulitis Active Problems:   Dental abscess   Uncontrolled type 2 diabetes mellitus with hyperglycemia, without long-term current use of insulin  (HCC)   Obesity, Class III, BMI 40-49.9 (morbid obesity)   Admitted From: Home Disposition:  Home   Recommendations for Outpatient Follow-up:  PCP: in 1 wk Follow-up with a dentist in 1 week Follow up LABS/TEST:     Diet recommendation: Carb modified diet  Activity: The patient is advised to gradually reintroduce usual activities, as tolerated  Discharge Condition: stable  Code Status: Full code   History of present illness: As per the H and P dictated on admission Hospital Course:  Raef Sprigg is a 38 y.o. male with medical history significant for DM and morbid obesity being admitted with facial cellulitis secondary to odontogenic/periapical abscess.  Was seen in urgent care the day prior and started on Augmentin  but took just 1 dose but had worsening symptoms.  With worsening facial swelling right mandibular area and having difficulty opening his mouth.  He denies fever or chills. ED course and data review: Vitals unremarkable Labs notable for elevated blood sugar of 254 but normal WBC and otherwise unremarkabl CT.Maxillofacial showing the following: IMPRESSION: 1. Positive for odontogenic Right Face Cellulitis. Regional inflammation superimposed on carious right mandible molar with periapical lucency, dehiscence, and overlying small 12 mm Subperiosteal Abscess. Reactive right level 1 and 2 lymph nodes. No other complicating features. 2. Additional carious right maxillary dentition. Unrelated appearing left maxillary sinus disease. Chronic postinflammatory appearance of the parotid glands.   Patient treated  with Unasyn  and given Toradol  for pain and an NS bolus Hospitalist consulted for admission.    Assessment and Plan:   # Facial cellulitis and Dental abscess S/p Unasyn , Pain control and Soft diet.  Pain resolved, patient is feeling improvement.  Transition to oral antibiotics Augmentin  twice daily for 7 days.  Patient was advised to follow with PCP and dentist in 1 week.  Advised to brush twice a day and use floss and use Peridex  twice a day until follow-up with dentist.   # Uncontrolled type 2 diabetes mellitus with hyperglycemia, without long-term current use of insulin : s/p SSI, resumed metformin on discharge.  Continue diabetic diet and monitor CBG at home and follow with PCP.   # Vitamin D insufficiency, vit D level 23, started vitamin D 50,000 units p.o. weekly.  Follow-up with PCP to repeat vitamin D level after 3 to 6 months.   # Obesity, Class III, BMI 40-49.9 (morbid obesity) Body mass index is 47.9 kg/m.  Interventions:Calorie restricted diet and daily exercise advised to lose body weight.  Lifestyle modification discussed.   Body mass index is 47.9 kg/m.  Nutrition Interventions:  Patient was ambulatory without any assistance. On the day of the discharge the patient's vitals were stable, and no other acute medical condition were reported by patient. the patient was felt safe to be discharge at Home.  Consultants: None Procedures: None  Discharge Exam: General: Appear in no distress, no Rash; Oral Mucosa Clear, moist. Cardiovascular: S1 and S2 Present, no Murmur, Respiratory: normal respiratory effort, Bilateral Air entry present and no Crackles, no wheezes Abdomen: Bowel Sound present, Soft and no tenderness, no hernia Extremities: no Pedal edema, no calf tenderness Neurology: alert and oriented to  time, place, and person affect appropriate.  Filed Weights   06/19/23 0217 06/19/23 0630  Weight: 136.1 kg (!) 138.7 kg   Vitals:   06/21/23 0511 06/21/23 0719  BP:  98/81 113/77  Pulse: 71 62  Resp: 18 17  Temp: 97.6 F (36.4 C) 98.3 F (36.8 C)  SpO2: 95% 98%    DISCHARGE MEDICATION: Allergies as of 06/21/2023   No Known Allergies      Medication List     STOP taking these medications    acetaminophen -codeine 300-30 MG tablet Commonly known as: TYLENOL  #3   benzonatate  100 MG capsule Commonly known as: TESSALON        TAKE these medications    acetaminophen  325 MG tablet Commonly known as: TYLENOL  Take 2 tablets (650 mg total) by mouth every 6 (six) hours as needed for mild pain (pain score 1-3), fever or headache.   amoxicillin -clavulanate 875-125 MG tablet Commonly known as: AUGMENTIN  Take 1 tablet by mouth every 12 (twelve) hours for 7 days.   chlorhexidine  0.12 % solution Commonly known as: Peridex  Use as directed 15 mLs in the mouth or throat 2 (two) times daily.   ibuprofen  200 MG tablet Commonly known as: ADVIL  Take 2 tablets (400 mg total) by mouth every 8 (eight) hours as needed (pain). What changed:  medication strength how much to take   loperamide  2 MG capsule Commonly known as: IMODIUM  Take 1 capsule (2 mg total) by mouth 4 (four) times daily as needed for diarrhea or loose stools.   metFORMIN 500 MG tablet Commonly known as: GLUCOPHAGE Take by mouth 2 (two) times daily with a meal.   ondansetron  4 MG disintegrating tablet Commonly known as: ZOFRAN -ODT Take 1 tablet (4 mg total) by mouth every 8 (eight) hours as needed.   Vitamin D (Ergocalciferol) 1.25 MG (50000 UNIT) Caps capsule Commonly known as: DRISDOL Take 1 capsule (50,000 Units total) by mouth every 7 (seven) days. Start taking on: June 27, 2023       No Known Allergies Discharge Instructions     Call MD for:  difficulty breathing, headache or visual disturbances   Complete by: As directed    Call MD for:  extreme fatigue   Complete by: As directed    Call MD for:  persistant dizziness or light-headedness   Complete by: As  directed    Call MD for:  redness, tenderness, or signs of infection (pain, swelling, redness, odor or green/yellow discharge around incision site)   Complete by: As directed    Call MD for:  severe uncontrolled pain   Complete by: As directed    Call MD for:  temperature >100.4   Complete by: As directed    Diet - low sodium heart healthy   Complete by: As directed    Discharge instructions   Complete by: As directed    F/u with PCP in 1 wk F/u with Dentist in 1 wk   Increase activity slowly   Complete by: As directed        The results of significant diagnostics from this hospitalization (including imaging, microbiology, ancillary and laboratory) are listed below for reference.    Significant Diagnostic Studies: CT Maxillofacial W Contrast Result Date: 06/19/2023 CLINICAL DATA:  38 year old male with onset of tooth pain, associated unilateral facial swelling. EXAM: CT MAXILLOFACIAL WITH CONTRAST TECHNIQUE: Multidetector CT imaging of the maxillofacial structures was performed with intravenous contrast. Multiplanar CT image reconstructions were also generated. RADIATION DOSE REDUCTION: This exam was performed  according to the departmental dose-optimization program which includes automated exposure control, adjustment of the mA and/or kV according to patient size and/or use of iterative reconstruction technique. CONTRAST:  75mL OMNIPAQUE IOHEXOL 300 MG/ML  SOLN COMPARISON:  Neck CT 05/24/2017. FINDINGS: Osseous: No acute left side dental finding. Mandible, bilateral TMJ normally located. But carious right posterior dentition, maxillary and mandible molars. Pronounced periapical lucency at the carious right mandible molar with evidence cortical dehiscence series 4, image 33. Regional soft tissue swelling and inflammation, described below. Bilateral maxilla, zygoma, pterygoid, nasal bones appear intact. Visible skull base and cervical vertebrae appear intact and aligned. Visible calvarium  intact. Orbits: Intact orbital walls. Globes and intraorbital soft tissues appear normal. Sinuses: Clear aside from opacified left maxillary sinus, which does not appear to be odontogenic on that side. Tympanic cavities and mastoids are well aerated. Soft tissues: Negative visible thyroid, larynx, pharynx, parapharyngeal spaces, retropharyngeal space (partially retropharyngeal carotids, normal variant) left side submandibular and masticator spaces. There is coarse bilateral parotid gland heterogeneity suggesting sequelae of chronic or recurrent glandular inflammation. The submandibular glands appear spared, normal. Sublingual space remains normal. Mostly superficial soft tissue swelling and stranding along the right face, with confluent roughly 5 cm phlegmonous area lateral to the body of the right mandible (series 2, image 72) and evidence of small subperiosteal abscess overlying the mandible there on series 2, image 70 (about 12 by 4 by 12 mm (AP by transverse by CC). Thickening of the platysma, subcutaneous stranding extending inferiorly. No soft tissue gas. Subcentimeter but asymmetrically enhancing right level 1 and level 2 lymph nodes appear reactive. No other lymphadenopathy. Suboptimal intravascular contrast timing but the major vascular structures in the visible face and at the skull base appear to remain patent. Limited intracranial: Negative. Left vertebral artery appears dominant, normal variant. IMPRESSION: 1. Positive for odontogenic Right Face Cellulitis. Regional inflammation superimposed on carious right mandible molar with periapical lucency, dehiscence, and overlying small 12 mm Subperiosteal Abscess. Reactive right level 1 and 2 lymph nodes. No other complicating features. 2. Additional carious right maxillary dentition. Unrelated appearing left maxillary sinus disease. Chronic postinflammatory appearance of the parotid glands. Electronically Signed   By: Marlise Simpers M.D.   On: 06/19/2023 04:13     Microbiology: No results found for this or any previous visit (from the past 240 hours).   Labs: CBC: Recent Labs  Lab 06/19/23 0315 06/20/23 0924 06/21/23 0448  WBC 10.2 6.1 7.0  NEUTROABS 7.3  --   --   HGB 16.4 16.4 15.3  HCT 48.1 47.5 44.8  MCV 85.7 85.4 85.0  PLT 184 181 179   Basic Metabolic Panel: Recent Labs  Lab 06/19/23 0315 06/20/23 0924 06/21/23 0448  NA 132* 136 136  K 3.8 3.8 3.8  CL 98 103 103  CO2 24 27 27   GLUCOSE 254* 166* 174*  BUN 15 12 16   CREATININE 0.79 0.90 0.89  CALCIUM  8.5* 8.4* 8.5*  MG  --  2.2 2.0  PHOS  --  3.2 3.6   Liver Function Tests: Recent Labs  Lab 06/19/23 0315  AST 18  ALT 29  ALKPHOS 55  BILITOT 0.9  PROT 7.3  ALBUMIN 3.5   No results for input(s): "LIPASE", "AMYLASE" in the last 168 hours. No results for input(s): "AMMONIA" in the last 168 hours. Cardiac Enzymes: No results for input(s): "CKTOTAL", "CKMB", "CKMBINDEX", "TROPONINI" in the last 168 hours. BNP (last 3 results) No results for input(s): "BNP" in the last 8760 hours.  CBG: Recent Labs  Lab 06/20/23 1148 06/20/23 1604 06/20/23 2132 06/21/23 0744 06/21/23 1127  GLUCAP 240* 130* 241* 175* 210*    Time spent: 35 minutes  Signed:  Althia Atlas  [facility] 06/21/2023 12:13 PM

## 2023-10-13 ENCOUNTER — Other Ambulatory Visit: Payer: Self-pay

## 2023-10-13 ENCOUNTER — Emergency Department
Admission: EM | Admit: 2023-10-13 | Discharge: 2023-10-13 | Disposition: A | Discharge status: Home / Self Care | Payer: Self-pay | Attending: Emergency Medicine | Admitting: Emergency Medicine

## 2023-10-13 DIAGNOSIS — E119 Type 2 diabetes mellitus without complications: Secondary | ICD-10-CM | POA: Insufficient documentation

## 2023-10-13 DIAGNOSIS — K047 Periapical abscess without sinus: Secondary | ICD-10-CM | POA: Insufficient documentation

## 2023-10-13 LAB — BASIC METABOLIC PANEL WITH GFR
Anion gap: 11 (ref 5–15)
BUN: 19 mg/dL (ref 6–20)
CO2: 24 mmol/L (ref 22–32)
Calcium: 8.5 mg/dL — ABNORMAL LOW (ref 8.9–10.3)
Chloride: 101 mmol/L (ref 98–111)
Creatinine, Ser: 1.11 mg/dL (ref 0.61–1.24)
GFR, Estimated: >60 mL/min (ref >60)
Glucose, Bld: 264 mg/dL — ABNORMAL HIGH (ref 70–99)
Potassium: 4.2 mmol/L (ref 3.5–5.1)
Sodium: 136 mmol/L (ref 135–145)

## 2023-10-13 LAB — CBC
HCT: 46.3 % (ref 39.0–52.0)
Hemoglobin: 16.3 g/dL (ref 13.0–17.0)
MCH: 29.9 pg (ref 26.0–34.0)
MCHC: 35.2 g/dL (ref 30.0–36.0)
MCV: 85 fL (ref 80.0–100.0)
Platelets: 203 K/uL (ref 150–400)
RBC: 5.45 MIL/uL (ref 4.22–5.81)
RDW: 13 % (ref 11.5–15.5)
WBC: 7.2 K/uL (ref 4.0–10.5)
nRBC: 0 % (ref 0.0–0.2)

## 2023-10-13 MED ORDER — CLINDAMYCIN HCL 150 MG PO CAPS
300.0000 mg | ORAL_CAPSULE | Freq: Once | ORAL | Status: AC
  Administered 2023-10-13: 300 mg via ORAL
  Filled 2023-10-13: qty 2

## 2023-10-13 MED ORDER — CLINDAMYCIN HCL 150 MG PO CAPS: 300.0000 mg | ORAL_CAPSULE | Freq: Three times a day (TID) | ORAL | 0 refills | Status: AC

## 2023-10-13 NOTE — ED Provider Notes (Signed)
 [facility] Provider Note    Event Date/Time   First MD Initiated Contact with Patient 10/13/23 1607     (approximate)   History   Abscess   HPI  Bradley Hood is a 38 y.o. male history of diabetes, recurrent dental abscess presents emergency department complaining of right lower jaw pain, some swelling, thinks he has another abscess.  Was admitted to the hospital in June for cellulitis of the face due to dental abscess.  Has not been able to see a dentist.  Did put his name on the list but they have not called him.  States his insurance with work will activate at the end of the month.  Denies fever, chills      Physical Exam   Triage Vital Signs: ED Triage Vitals  Encounter Vitals Group     BP 10/13/23 1516 (!) 146/89     Girls Systolic BP Percentile --      Girls Diastolic BP Percentile --      Boys Systolic BP Percentile --      Boys Diastolic BP Percentile --      Pulse Rate 10/13/23 1516 77     Resp 10/13/23 1516 18     Temp 10/13/23 1516 98.4 F (36.9 C)     Temp src --      SpO2 10/13/23 1516 95 %     Weight 10/13/23 1517 290 lb (131.5 kg)     Height 10/13/23 1517 5' 7 (1.702 m)     Head Circumference --      Peak Flow --      Pain Score 10/13/23 1517 0     Pain Loc --      Pain Education --      Exclude from Growth Chart --     Most recent vital signs: Vitals:   10/13/23 1516  BP: (!) 146/89  Pulse: 77  Resp: 18  Temp: 98.4 F (36.9 C)  SpO2: 95%     General: Awake, no distress.   CV:  Good peripheral perfusion.  Resp:  Normal effort.  Abd:  No distention.   Other:     ED Results / Procedures / Treatments   Labs (all labs ordered are listed, but only abnormal results are displayed) Labs Reviewed  BASIC METABOLIC PANEL WITH GFR - Abnormal; Notable for the following components:      Result Value   Glucose, Bld 264 (*)    Calcium  8.5 (*)    All other components within normal limits  CBC      EKG     RADIOLOGY     PROCEDURES:   Procedures  Critical Care:  no Chief Complaint  Patient presents with   Abscess      MEDICATIONS ORDERED IN ED: Medications  clindamycin (CLEOCIN) capsule 300 mg (has no administration in time range)     IMPRESSION / MDM / ASSESSMENT AND PLAN / ED COURSE  I reviewed the triage vital signs and the nursing notes.                              Differential diagnosis includes, but is not limited to, cellulitis, dental abscess, dental caries  Patient's presentation is most consistent with acute illness / injury with system symptoms.   CBC metabolic panel basically reassuring, glucose little elevated but not grossly out of the patient's norm  Physical exam is consistent with dental caries  and the beginning stages of an abscess.  Do not see any cellulitis on the face to warrant further workup or admission.  He was given a dose of clindamycin here in the ED.  He is to follow-up with one of the dental clinics provided.  Prescription for clindamycin provided.  Work note provided.  No red flags at this time.  Patient was discharged stable condition.      FINAL CLINICAL IMPRESSION(S) / ED DIAGNOSES   Final diagnoses:  Dental abscess     Rx / DC Orders   ED Discharge Orders          Ordered    clindamycin (CLEOCIN) 150 MG capsule  3 times daily        10/13/23 1614             Note:  This document was prepared using Dragon voice recognition software and may include unintentional dictation errors.    Gasper Devere ORN, PA-C 10/13/23 1618    Arlander Charleston, MD 10/13/23 [phone]

## 2023-10-13 NOTE — Discharge Instructions (Signed)

## 2023-10-13 NOTE — ED Notes (Signed)
 Patient declined discharge vital signs.

## 2023-10-13 NOTE — ED Triage Notes (Signed)
 Pt to ED for dental abscess to right lower x3 days with swelling to jaw. Recently admitted for facial cellulitis. Denies fevers.

## 2023-11-10 ENCOUNTER — Ambulatory Visit (INDEPENDENT_AMBULATORY_CARE_PROVIDER_SITE_OTHER)

## 2023-11-10 ENCOUNTER — Ambulatory Visit
Admission: RE | Admit: 2023-11-10 | Discharge: 2023-11-10 | Disposition: A | Discharge status: Home / Self Care | Source: Ambulatory Visit | Attending: Physician Assistant | Admitting: Physician Assistant

## 2023-11-10 VITALS — BP 112/78 | HR 76 | Temp 98.6°F | Resp 18

## 2023-11-10 DIAGNOSIS — S8391XA Sprain of unspecified site of right knee, initial encounter: Secondary | ICD-10-CM | POA: Diagnosis not present

## 2023-11-10 DIAGNOSIS — M25561 Pain in right knee: Secondary | ICD-10-CM | POA: Diagnosis not present

## 2023-11-10 MED ORDER — NAPROXEN 500 MG PO TABS: 500.0000 mg | ORAL_TABLET | Freq: Two times a day (BID) | ORAL | 0 refills | Status: AC

## 2023-11-10 NOTE — ED Triage Notes (Signed)
 Pt states he is having pain in his right knee that started Monday. 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

## 2023-11-10 NOTE — ED Provider Notes (Signed)
 [facility]    CSN: [id number] Arrival date & time: 11/10/23  1340      History   Chief Complaint Chief Complaint  Patient presents with   Knee Injury    My right knee hurts in the lower part - Entered by patient    HPI Bradley Hood is a 38 y.o. male presenting for right knee pain x 3 days. Pain is over patellar tendon. 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 Denies fall or trauma. Denies swelling, contusion, erythema.  Limping slightly. Denies weakness or instability. Has not been taking OTC meds.  HPI  Past Medical History:  Diagnosis Date   Diabetes mellitus without complication [facility])     Patient Active Problem List   Diagnosis Date Noted   Facial cellulitis 06/19/2023   Obesity, Class III, BMI 40-49.9 (morbid obesity) (HCC) 06/19/2023   Uncontrolled type 2 diabetes mellitus with hyperglycemia, without long-term current use of insulin  (HCC) 06/19/2023   Dental abscess 06/19/2023   Obesity, unspecified 12/03/2018   Uncontrolled type 2 diabetes mellitus 12/03/2018    Past Surgical History:  Procedure Laterality Date   NO PAST SURGERIES         Home Medications    Prior to Admission medications   Medication Sig Start Date End Date Taking? Authorizing Provider  naproxen  (NAPROSYN ) 500 MG tablet Take 1 tablet (500 mg total) by mouth 2 (two) times daily. 11/10/23  Yes Arvis Jolan NOVAK, PA-C  acetaminophen  (TYLENOL ) 325 MG tablet Take 2 tablets (650 mg total) by mouth every 6 (six) hours as needed for mild pain (pain score 1-3), fever or headache. 06/21/23   Von Bellis, MD  chlorhexidine  (PERIDEX ) 0.12 % solution Use as directed 15 mLs in the mouth or throat 2 (two) times daily. 06/21/23   Von Bellis, MD  clindamycin (CLEOCIN) 150 MG capsule Take 2 capsules (300 mg total) by mouth 3 (three) times daily. 10/13/23   Fisher, Devere ORN, PA-C  ibuprofen  (ADVIL ) 200 MG tablet Take 2 tablets (400 mg  total) by mouth every 8 (eight) hours as needed (pain). 06/21/23   Von Bellis, MD  loperamide  (IMODIUM ) 2 MG capsule Take 1 capsule (2 mg total) by mouth 4 (four) times daily as needed for diarrhea or loose stools. 04/08/23   Teresa Shelba SAUNDERS, NP  metFORMIN (GLUCOPHAGE) 500 MG tablet Take by mouth 2 (two) times daily with a meal.    [provider]  ondansetron  (ZOFRAN -ODT) 4 MG disintegrating tablet Take 1 tablet (4 mg total) by mouth every 8 (eight) hours as needed. 04/08/23   Teresa Shelba SAUNDERS, NP    Family History History reviewed. No pertinent family history.  Social History Social History   Tobacco Use   Smoking status: Never   Smokeless tobacco: Never  Vaping Use   Vaping status: Never Used  Substance Use Topics   Alcohol use: Yes    Comment: twice monthly   Drug use: No     Allergies   Patient has no known allergies.   Review of Systems Review of Systems  Musculoskeletal:  Positive for arthralgias. Negative for gait problem and joint swelling.  Skin:  Negative for color change and wound.  Neurological:  Negative for weakness and numbness.     Physical Exam Triage Vital Signs ED Triage Vitals  Encounter Vitals Group     BP      Girls Systolic BP Percentile  Girls Diastolic BP Percentile      Boys Systolic BP Percentile      Boys Diastolic BP Percentile      Pulse      Resp      Temp      Temp src      SpO2      Weight      Height      Head Circumference      Peak Flow      Pain Score      Pain Loc      Pain Education      Exclude from Growth Chart    No data found.  Updated Vital Signs BP 112/78 (BP Location: Right Arm)   Pulse 76   Temp 98.6 F (37 C) (Oral)   Resp 18   SpO2 95%   Physical Exam Vitals and nursing note reviewed.  Constitutional:      General: He is not in acute distress.    Appearance: Normal appearance. He is well-developed. He is not ill-appearing.  HENT:     Head: Normocephalic and atraumatic.  Eyes:      General: No scleral icterus.    Conjunctiva/sclera: Conjunctivae normal.  Cardiovascular:     Rate and Rhythm: Normal rate.     Pulses: Normal pulses.  Pulmonary:     Effort: Pulmonary effort is normal. No respiratory distress.  Musculoskeletal:     Cervical back: Neck supple.     Right knee: No swelling. Normal range of motion. Tenderness present over the patellar tendon.  Skin:    General: Skin is warm and dry.     Capillary Refill: Capillary refill takes less than 2 seconds.  Neurological:     General: No focal deficit present.     Mental Status: He is alert. Mental status is at baseline.     Motor: No weakness.     Gait: Gait normal.  Psychiatric:        Mood and Affect: Mood normal.        Behavior: Behavior normal.      UC Treatments / Results  Labs (all labs ordered are listed, but only abnormal results are displayed) Labs Reviewed - No data to display  EKG   Radiology DG Knee Complete 4 Views Right Result Date: 11/10/2023 CLINICAL DATA:  Right knee pain a few days.  No particular injury. EXAM: RIGHT KNEE - COMPLETE 4+ VIEW COMPARISON:  None Available. FINDINGS: No evidence of fracture, dislocation, or joint effusion. No evidence of arthropathy or other focal bone abnormality. Soft tissues are unremarkable. IMPRESSION: Negative. Electronically Signed   By: Toribio Agreste M.D.   On: 11/10/2023 15:26    Procedures Procedures (including critical care time)  Medications Ordered in UC Medications - No data to display  Initial Impression / Assessment and Plan / UC Course  I have reviewed the triage vital signs and the nursing notes.  Pertinent labs & imaging results that were available during my care of the patient were reviewed by me and considered in my medical decision making (see chart for details).   38 y/o male presents for right knee pain x 3 days. 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   Right knee x-ray obtained.  Negative.   Knee sprain vs patellar tendonitis. Brace given for support.  Sent naproxen  pharmacy.  Reviewed rest guidelines.  If no improvement in the next week advised him to follow-up with [facility].  Work note  was given.   Final Clinical Impressions(s) / UC Diagnoses   Final diagnoses:  Acute pain of right knee  Sprain of right knee, unspecified ligament, initial encounter     Discharge Instructions      SPRAIN: Stressed avoiding painful activities . Reviewed RICE guidelines. Use medications as directed, including NSAIDs. If no NSAIDs have been prescribed for you today, you may take Aleve  or Motrin  over the counter. May use Tylenol  in between doses of NSAIDs. Avoid painful activities. If no improvement in the next 1-2 weeks, f/u with [facility].    You have a condition requiring you to follow up with Orthopedics so please call one of the following office for appointment:   [facility] Address: [address], [place], [place] [address] Phone: [phone]  [facility] [address] [place], [place] [address] Phone: [phone]  [facility] [address] [place], [place] [address] Phone: [phone]      ED Prescriptions     Medication Sig Dispense Auth. Provider   naproxen  (NAPROSYN ) 500 MG tablet Take 1 tablet (500 mg total) by mouth 2 (two) times daily. 30 tablet Makila Colombe B, PA-C      PDMP not reviewed this encounter.   Arvis Jolan NOVAK, PA-C 11/10/23 [phone]

## 2023-11-10 NOTE — Discharge Instructions (Addendum)
 SPRAIN: Stressed avoiding painful activities . Reviewed RICE guidelines. Use medications as directed, including NSAIDs. If no NSAIDs have been prescribed for you today, you may take Aleve  or Motrin  over the counter. May use Tylenol  in between doses of NSAIDs. Avoid painful activities. If no improvement in the next 1-2 weeks, f/u with [facility].    You have a condition requiring you to follow up with Orthopedics so please call one of the following office for appointment:   [facility] Address: [address] [place], [place] [address] Phone: [phone]  [facility] [address] [place], [place] [address] Phone: [phone]  [facility] [address], [place], [place] [address] Phone: [phone]

## 2024-01-13 ENCOUNTER — Emergency Department
Admission: EM | Admit: 2024-01-13 | Discharge: 2024-01-13 | Disposition: A | Discharge status: Home / Self Care | Attending: Emergency Medicine | Admitting: Emergency Medicine

## 2024-01-13 ENCOUNTER — Other Ambulatory Visit: Payer: Self-pay

## 2024-01-13 DIAGNOSIS — R197 Diarrhea, unspecified: Secondary | ICD-10-CM | POA: Insufficient documentation

## 2024-01-13 DIAGNOSIS — R110 Nausea: Secondary | ICD-10-CM

## 2024-01-13 DIAGNOSIS — R112 Nausea with vomiting, unspecified: Secondary | ICD-10-CM | POA: Diagnosis not present

## 2024-01-13 LAB — CBG MONITORING, ED: Glucose-Capillary: 167 mg/dL — ABNORMAL HIGH (ref 70–99)

## 2024-01-13 LAB — CBC WITH DIFFERENTIAL/PLATELET
Abs Immature Granulocytes: 0.03 K/uL (ref 0.00–0.07)
Basophils Absolute: 0 K/uL (ref 0.0–0.1)
Basophils Relative: 0 %
Eosinophils Absolute: 0.2 K/uL (ref 0.0–0.5)
Eosinophils Relative: 3 %
HCT: 51.7 % (ref 39.0–52.0)
Hemoglobin: 17.7 g/dL — ABNORMAL HIGH (ref 13.0–17.0)
Immature Granulocytes: 0 %
Lymphocytes Relative: 12 %
Lymphs Abs: 0.9 K/uL (ref 0.7–4.0)
MCH: 29.5 pg (ref 26.0–34.0)
MCHC: 34.2 g/dL (ref 30.0–36.0)
MCV: 86 fL (ref 80.0–100.0)
Monocytes Absolute: 0.5 K/uL (ref 0.1–1.0)
Monocytes Relative: 6 %
Neutro Abs: 5.9 K/uL (ref 1.7–7.7)
Neutrophils Relative %: 79 %
Platelets: 175 K/uL (ref 150–400)
RBC: 6.01 MIL/uL — ABNORMAL HIGH (ref 4.22–5.81)
RDW: 12.8 % (ref 11.5–15.5)
WBC: 7.5 K/uL (ref 4.0–10.5)
nRBC: 0 % (ref 0.0–0.2)

## 2024-01-13 LAB — COMPREHENSIVE METABOLIC PANEL WITH GFR
ALT: 29 U/L (ref 0–44)
AST: 20 U/L (ref 15–41)
Albumin: 4.2 g/dL (ref 3.5–5.0)
Alkaline Phosphatase: 69 U/L (ref 38–126)
Anion gap: 9 (ref 5–15)
BUN: 17 mg/dL (ref 6–20)
CO2: 26 mmol/L (ref 22–32)
Calcium: 9.2 mg/dL (ref 8.9–10.3)
Chloride: 98 mmol/L (ref 98–111)
Creatinine, Ser: 0.99 mg/dL (ref 0.61–1.24)
GFR, Estimated: >60 mL/min
Glucose, Bld: 162 mg/dL — ABNORMAL HIGH (ref 70–99)
Potassium: 4.4 mmol/L (ref 3.5–5.1)
Sodium: 133 mmol/L — ABNORMAL LOW (ref 135–145)
Total Bilirubin: 1.1 mg/dL (ref 0.0–1.2)
Total Protein: 8.2 g/dL — ABNORMAL HIGH (ref 6.5–8.1)

## 2024-01-13 MED ORDER — ONDANSETRON 8 MG PO TBDP: 8.0000 mg | ORAL_TABLET | Freq: Once | ORAL | Status: DC

## 2024-01-13 MED ORDER — ONDANSETRON HCL 4 MG/2ML IJ SOLN
4.0000 mg | Freq: Once | INTRAMUSCULAR | Status: AC
  Administered 2024-01-13: 4 mg via INTRAVENOUS
  Filled 2024-01-13: qty 2

## 2024-01-13 NOTE — Discharge Instructions (Addendum)
 Please use loperamide  2 mg after every episode of diarrhea as needed for up to 4 doses in one 24-hour period.  Please only use this medication if you feel you are becoming too dehydrated

## 2024-01-13 NOTE — ED Provider Notes (Signed)
 "  [facility] Provider Note   Event Date/Time   First MD Initiated Contact with Patient 01/13/24 1355     (approximate) History  Diarrhea and Nausea  HPI Bradley Hood is a 39 y.o. male with no stated past medical history presents complaining of diarrhea that began today with associated nausea.  Patient states that he has had dry heaving but has not vomited.  Patient endorses previous sick contact of his mother who came to his house on the 31st with similar symptoms including nausea, vomiting, and diarrhea.  Patient denies any recent travel, food out of the ordinary, or any history of GI diagnoses.  Patient denies any fever, cough, fatigue ROS: Patient currently denies any vision changes, tinnitus, difficulty speaking, facial droop, sore throat, chest pain, shortness of breath, abdominal pain, vomiting, dysuria, or weakness/numbness/paresthesias in any extremity   Physical Exam  Triage Vital Signs: ED Triage Vitals  Encounter Vitals Group     BP 01/13/24 1327 116/81     Girls Systolic BP Percentile --      Girls Diastolic BP Percentile --      Boys Systolic BP Percentile --      Boys Diastolic BP Percentile --      Pulse Rate 01/13/24 1327 94     Resp --      Temp 01/13/24 1327 98.2 F (36.8 C)     Temp Source 01/13/24 1327 Oral     SpO2 01/13/24 1327 99 %     Weight 01/13/24 1329 292 lb (132.5 kg)     Height 01/13/24 1329 5' 7 (1.702 m)     Head Circumference --      Peak Flow --      Pain Score 01/13/24 1329 0     Pain Loc --      Pain Education --      Exclude from Growth Chart --    Most recent vital signs: Vitals:   01/13/24 1327  BP: 116/81  Pulse: 94  Temp: 98.2 F (36.8 C)  SpO2: 99%   General: Awake, oriented x4. CV:  Good peripheral perfusion. Resp:  Normal effort. Abd:  No distention. Other:  Morbidly obese middle-aged Hispanic male resting comfortably in no acute distress ED Results / Procedures / Treatments  Labs (all labs  ordered are listed, but only abnormal results are displayed) Labs Reviewed  COMPREHENSIVE METABOLIC PANEL WITH GFR - Abnormal; Notable for the following components:      Result Value   Sodium 133 (*)    Glucose, Bld 162 (*)    Total Protein 8.2 (*)    All other components within normal limits  CBC WITH DIFFERENTIAL/PLATELET - Abnormal; Notable for the following components:   RBC 6.01 (*)    Hemoglobin 17.7 (*)    All other components within normal limits   PROCEDURES: Critical Care performed: No Procedures MEDICATIONS ORDERED IN ED: Medications  ondansetron  (ZOFRAN ) injection 4 mg (has no administration in time range)   IMPRESSION / MDM / ASSESSMENT AND PLAN / ED COURSE  I reviewed the triage vital signs and the nursing notes.                             The patient is on the cardiac monitor to evaluate for evidence of arrhythmia and/or significant heart rate changes. Patient's presentation is most consistent with acute presentation with potential threat to life or bodily function. Patient is a  61-year-old male with no stated past medical history presents for 12 hours of nausea and diarrhea without belly pain DDx: Infectious diarrhea, gastroenteritis, diverticulitis, appendicitis Plan: CBC, CMP, nausea/vomiting control, p.o. tolerance  Based on patient's physical exam laboratory evaluation, he does not show any signs of red flag symptomatology at this time.  Patient has no tenderness palpation on exam and no leukocytosis to concern for appendicitis or diverticulitis.  Given the patient had recent sick contact with similar symptoms, it is reasonable to assume that this is likely infectious gastroenteritis and therefore we will treat with Zofran  and expectant management.  Patient agrees with plan for discharge at this time and outpatient follow-up as needed.  Patient given strict return precautions and all questions answered prior to discharge  Dispo: Discharge home with PCP follow-up as  needed   FINAL CLINICAL IMPRESSION(S) / ED DIAGNOSES   Final diagnoses:  Diarrhea of presumed infectious origin  Nausea   Rx / DC Orders   ED Discharge Orders     None      Note:  This document was prepared using Dragon voice recognition software and may include unintentional dictation errors.   [name], MD 01/13/24 1500  "

## 2024-01-13 NOTE — ED Triage Notes (Signed)
 C/o nausea and diarrhea started this AM. Denies any other complaints. PMH: DM2, noncompliant with meds. GCS 15
